# Patient Record
Sex: Female | Born: 1955 | Race: White | Hispanic: No | Marital: Married | Smoking: Never smoker
Health system: Southern US, Community
[De-identification: ages and names within clinical notes are randomized; demographics above are authoritative.]

## PROBLEM LIST (undated history)

## (undated) DIAGNOSIS — E785 Hyperlipidemia, unspecified: Secondary | ICD-10-CM

## (undated) DIAGNOSIS — K219 Gastro-esophageal reflux disease without esophagitis: Secondary | ICD-10-CM

## (undated) DIAGNOSIS — Z9889 Other specified postprocedural states: Secondary | ICD-10-CM

## (undated) DIAGNOSIS — F419 Anxiety disorder, unspecified: Secondary | ICD-10-CM

## (undated) DIAGNOSIS — R112 Nausea with vomiting, unspecified: Secondary | ICD-10-CM

## (undated) DIAGNOSIS — G4700 Insomnia, unspecified: Secondary | ICD-10-CM

## (undated) DIAGNOSIS — F32A Depression, unspecified: Secondary | ICD-10-CM

## (undated) DIAGNOSIS — G4730 Sleep apnea, unspecified: Secondary | ICD-10-CM

## (undated) DIAGNOSIS — I10 Essential (primary) hypertension: Secondary | ICD-10-CM

## (undated) DIAGNOSIS — J45909 Unspecified asthma, uncomplicated: Secondary | ICD-10-CM

## (undated) DIAGNOSIS — K2100 Gastro-esophageal reflux disease with esophagitis, without bleeding: Secondary | ICD-10-CM

## (undated) HISTORY — DX: Hyperlipidemia, unspecified: E78.5

## (undated) HISTORY — PX: OOPHORECTOMY: SHX86

## (undated) HISTORY — PX: ABDOMINAL HYSTERECTOMY: SHX81

## (undated) HISTORY — PX: TENNIS ELBOW RELEASE/NIRSCHEL PROCEDURE: SHX6651

## (undated) HISTORY — PX: FOOT SURGERY: SHX648

## (undated) HISTORY — PX: BREAST BIOPSY: SHX20

## (undated) HISTORY — DX: Essential (primary) hypertension: I10

## (undated) HISTORY — DX: Depression, unspecified: F32.A

## (undated) HISTORY — DX: Sleep apnea, unspecified: G47.30

## (undated) HISTORY — DX: Anxiety disorder, unspecified: F41.9

## (undated) HISTORY — DX: Gastro-esophageal reflux disease without esophagitis: K21.9

---

## 2004-05-06 ENCOUNTER — Ambulatory Visit: Payer: Self-pay | Admitting: Family Medicine

## 2004-05-14 ENCOUNTER — Ambulatory Visit: Payer: Self-pay | Admitting: Family Medicine

## 2005-05-13 ENCOUNTER — Ambulatory Visit: Payer: Self-pay | Admitting: Family Medicine

## 2006-05-16 ENCOUNTER — Ambulatory Visit: Payer: Self-pay | Admitting: Family Medicine

## 2007-05-23 ENCOUNTER — Ambulatory Visit: Payer: Self-pay | Admitting: Family Medicine

## 2008-05-29 ENCOUNTER — Ambulatory Visit: Payer: Self-pay | Admitting: Family Medicine

## 2009-06-01 ENCOUNTER — Ambulatory Visit: Payer: Self-pay | Admitting: Family Medicine

## 2009-12-25 ENCOUNTER — Ambulatory Visit: Payer: Self-pay | Admitting: Family Medicine

## 2010-06-21 ENCOUNTER — Ambulatory Visit: Payer: Self-pay | Admitting: Family Medicine

## 2010-07-06 ENCOUNTER — Ambulatory Visit: Payer: Self-pay | Admitting: Family Medicine

## 2010-07-20 ENCOUNTER — Ambulatory Visit: Payer: Self-pay | Admitting: Surgery

## 2010-09-01 ENCOUNTER — Ambulatory Visit: Payer: Self-pay | Admitting: Surgery

## 2010-12-08 ENCOUNTER — Ambulatory Visit: Payer: Self-pay | Admitting: Surgery

## 2010-12-31 ENCOUNTER — Ambulatory Visit: Payer: Self-pay | Admitting: Surgery

## 2011-06-27 ENCOUNTER — Ambulatory Visit: Payer: Self-pay | Admitting: Family Medicine

## 2011-10-04 ENCOUNTER — Ambulatory Visit: Payer: Self-pay | Admitting: Gastroenterology

## 2011-10-06 LAB — PATHOLOGY REPORT

## 2012-03-05 ENCOUNTER — Ambulatory Visit: Payer: Self-pay | Admitting: Internal Medicine

## 2012-03-12 ENCOUNTER — Ambulatory Visit: Payer: Self-pay | Admitting: Gastroenterology

## 2012-03-23 ENCOUNTER — Ambulatory Visit: Payer: Self-pay | Admitting: Gastroenterology

## 2012-03-30 ENCOUNTER — Ambulatory Visit: Payer: Self-pay | Admitting: Gastroenterology

## 2012-04-06 LAB — PATHOLOGY REPORT

## 2012-07-26 ENCOUNTER — Ambulatory Visit: Payer: Self-pay | Admitting: Family Medicine

## 2013-09-10 ENCOUNTER — Ambulatory Visit: Payer: Self-pay | Admitting: Family Medicine

## 2013-12-06 ENCOUNTER — Ambulatory Visit: Payer: Self-pay | Admitting: Physical Medicine and Rehabilitation

## 2014-01-13 DIAGNOSIS — M5416 Radiculopathy, lumbar region: Secondary | ICD-10-CM | POA: Insufficient documentation

## 2014-01-13 DIAGNOSIS — M47816 Spondylosis without myelopathy or radiculopathy, lumbar region: Secondary | ICD-10-CM | POA: Insufficient documentation

## 2014-01-13 DIAGNOSIS — G8929 Other chronic pain: Secondary | ICD-10-CM | POA: Insufficient documentation

## 2014-07-18 DIAGNOSIS — Z86010 Personal history of colonic polyps: Secondary | ICD-10-CM | POA: Insufficient documentation

## 2014-07-18 DIAGNOSIS — K2100 Gastro-esophageal reflux disease with esophagitis, without bleeding: Secondary | ICD-10-CM | POA: Insufficient documentation

## 2014-09-12 ENCOUNTER — Ambulatory Visit: Payer: Self-pay | Admitting: Family Medicine

## 2014-10-06 DIAGNOSIS — M5136 Other intervertebral disc degeneration, lumbar region: Secondary | ICD-10-CM | POA: Insufficient documentation

## 2014-12-26 DIAGNOSIS — M461 Sacroiliitis, not elsewhere classified: Secondary | ICD-10-CM | POA: Insufficient documentation

## 2015-06-30 ENCOUNTER — Other Ambulatory Visit: Payer: Self-pay | Admitting: Family Medicine

## 2015-09-08 ENCOUNTER — Other Ambulatory Visit: Payer: Self-pay | Admitting: Family Medicine

## 2015-09-08 DIAGNOSIS — Z1231 Encounter for screening mammogram for malignant neoplasm of breast: Secondary | ICD-10-CM

## 2015-09-23 ENCOUNTER — Ambulatory Visit: Payer: Self-pay | Attending: Family Medicine

## 2015-10-08 ENCOUNTER — Other Ambulatory Visit: Payer: Self-pay | Admitting: Physical Medicine and Rehabilitation

## 2015-10-08 DIAGNOSIS — M5417 Radiculopathy, lumbosacral region: Secondary | ICD-10-CM

## 2015-10-29 ENCOUNTER — Ambulatory Visit
Admission: RE | Admit: 2015-10-29 | Discharge: 2015-10-29 | Disposition: A | Discharge status: Home / Self Care | Payer: 59 | Source: Ambulatory Visit | Attending: Family Medicine | Admitting: Family Medicine

## 2015-10-29 ENCOUNTER — Ambulatory Visit
Admission: RE | Admit: 2015-10-29 | Discharge: 2015-10-29 | Disposition: A | Discharge status: Home / Self Care | Payer: 59 | Source: Ambulatory Visit | Attending: Physical Medicine and Rehabilitation | Admitting: Physical Medicine and Rehabilitation

## 2015-10-29 DIAGNOSIS — M47816 Spondylosis without myelopathy or radiculopathy, lumbar region: Secondary | ICD-10-CM | POA: Insufficient documentation

## 2015-10-29 DIAGNOSIS — Z1231 Encounter for screening mammogram for malignant neoplasm of breast: Secondary | ICD-10-CM

## 2015-10-29 DIAGNOSIS — M5416 Radiculopathy, lumbar region: Secondary | ICD-10-CM | POA: Insufficient documentation

## 2015-10-29 DIAGNOSIS — M5417 Radiculopathy, lumbosacral region: Secondary | ICD-10-CM

## 2016-01-15 ENCOUNTER — Other Ambulatory Visit: Payer: Self-pay | Admitting: Orthopedic Surgery

## 2016-01-15 DIAGNOSIS — M5442 Lumbago with sciatica, left side: Principal | ICD-10-CM

## 2016-01-15 DIAGNOSIS — G8929 Other chronic pain: Secondary | ICD-10-CM

## 2016-01-21 ENCOUNTER — Ambulatory Visit
Admission: RE | Admit: 2016-01-21 | Discharge: 2016-01-21 | Disposition: A | Discharge status: Home / Self Care | Payer: 59 | Source: Ambulatory Visit | Attending: Orthopedic Surgery | Admitting: Orthopedic Surgery

## 2016-01-21 ENCOUNTER — Encounter
Admission: RE | Admit: 2016-01-21 | Discharge: 2016-01-21 | Disposition: A | Discharge status: Home / Self Care | Payer: 59 | Source: Ambulatory Visit | Attending: Orthopedic Surgery | Admitting: Orthopedic Surgery

## 2016-01-21 DIAGNOSIS — M5442 Lumbago with sciatica, left side: Secondary | ICD-10-CM | POA: Insufficient documentation

## 2016-01-21 DIAGNOSIS — G8929 Other chronic pain: Secondary | ICD-10-CM | POA: Diagnosis not present

## 2016-01-21 MED ORDER — TECHNETIUM TC 99M MEDRONATE IV KIT
25.0000 | PACK | Freq: Once | INTRAVENOUS | Status: AC | PRN
  Administered 2016-01-21: 22.21 via INTRAVENOUS

## 2016-09-27 ENCOUNTER — Other Ambulatory Visit: Payer: Self-pay | Admitting: Family Medicine

## 2016-09-27 DIAGNOSIS — Z1231 Encounter for screening mammogram for malignant neoplasm of breast: Secondary | ICD-10-CM

## 2016-12-06 ENCOUNTER — Ambulatory Visit
Admission: RE | Admit: 2016-12-06 | Discharge: 2016-12-06 | Disposition: A | Discharge status: Home / Self Care | Payer: BLUE CROSS/BLUE SHIELD | Source: Ambulatory Visit | Attending: Family Medicine | Admitting: Family Medicine

## 2016-12-06 DIAGNOSIS — Z1231 Encounter for screening mammogram for malignant neoplasm of breast: Secondary | ICD-10-CM | POA: Insufficient documentation

## 2017-08-11 ENCOUNTER — Other Ambulatory Visit: Payer: Self-pay | Admitting: Family Medicine

## 2017-08-11 DIAGNOSIS — M5416 Radiculopathy, lumbar region: Secondary | ICD-10-CM

## 2017-08-18 ENCOUNTER — Ambulatory Visit
Admission: RE | Admit: 2017-08-18 | Discharge: 2017-08-18 | Disposition: A | Discharge status: Home / Self Care | Payer: BLUE CROSS/BLUE SHIELD | Source: Ambulatory Visit | Attending: Family Medicine | Admitting: Family Medicine

## 2017-08-18 DIAGNOSIS — M5416 Radiculopathy, lumbar region: Secondary | ICD-10-CM | POA: Diagnosis present

## 2017-09-04 ENCOUNTER — Encounter: Payer: Self-pay | Admitting: Podiatry

## 2017-09-04 ENCOUNTER — Ambulatory Visit: Payer: BLUE CROSS/BLUE SHIELD | Admitting: Podiatry

## 2017-09-04 VITALS — BP 117/87 | HR 65 | Resp 16

## 2017-09-04 DIAGNOSIS — R519 Headache, unspecified: Secondary | ICD-10-CM | POA: Insufficient documentation

## 2017-09-04 DIAGNOSIS — L600 Ingrowing nail: Secondary | ICD-10-CM

## 2017-09-04 DIAGNOSIS — R51 Headache: Secondary | ICD-10-CM

## 2017-09-04 DIAGNOSIS — M8580 Other specified disorders of bone density and structure, unspecified site: Secondary | ICD-10-CM | POA: Insufficient documentation

## 2017-09-04 DIAGNOSIS — B019 Varicella without complication: Secondary | ICD-10-CM | POA: Insufficient documentation

## 2017-09-04 NOTE — Progress Notes (Signed)
  Subjective:  Patient ID: Elizabeth Mann, female    DOB: 1955-12-04,  MRN: [id number] HPI Chief Complaint  Patient presents with  . Nail Problem    Hallux nail - thick, dark nail x years, tried "clear nail medicine" bought here before, helped some    62 y.o. female presents with the above complaint.     No past medical history on file. Past Surgical History:  Procedure Laterality Date  . BREAST BIOPSY Right    neg    Current Outpatient Medications:  .  estradiol (ESTRACE) 0.5 MG tablet, TAKE 1 TABLET BY MOUTH ONCE DAILY, Disp: , Rfl:  .  traZODone (DESYREL) 100 MG tablet, TAKE 1 AND 1/2 TABLETS BY MOUTH AT BEDTIME, MAY TAKE 2 TABLETS IF NEEDED, Disp: , Rfl:  .  aspirin EC 81 MG tablet, Take by mouth., Disp: , Rfl:  .  atorvastatin (LIPITOR) 10 MG tablet, Take 10 mg by mouth daily., Disp: , Rfl: 3 .  Calcium Carbonate-Vitamin D 600-400 MG-UNIT tablet, Take by mouth., Disp: , Rfl:  .  meloxicam (MOBIC) 7.5 MG tablet, TAKE 1 TABLET (7.5 MG TOTAL) BY MOUTH EVERY 12 (TWELVE) HOURS, Disp: , Rfl: 0 .  pantoprazole (PROTONIX) 40 MG tablet, Take 40 mg by mouth daily., Disp: , Rfl: 3 .  vitamin B-12 (CYANOCOBALAMIN) 1000 MCG tablet, Take by mouth., Disp: , Rfl:   Allergies  Allergen Reactions  . Contrast Media [Iodinated Diagnostic Agents]    Review of Systems  All other systems reviewed and are negative.  Objective:   Vitals:   09/04/17 1341  BP: 117/87  Pulse: 65  Resp: 16    General: Well developed, nourished, in no acute distress, alert and oriented x3   Dermatological: Skin is warm, dry and supple bilateral. Nails x 10 are well maintained; remaining integument appears unremarkable at this time. There are no open sores, no preulcerative lesions, no rash or signs of infection present.  Vascular: Dorsalis Pedis artery and Posterior Tibial artery pedal pulses are 2/4 bilateral with immedate capillary fill time. Pedal hair growth present. No varicosities and no lower  extremity edema present bilateral.   Neruologic: Grossly intact via light touch bilateral. Vibratory intact via tuning fork bilateral. Protective threshold with Semmes Wienstein monofilament intact to all pedal sites bilateral. Patellar and Achilles deep tendon reflexes 2+ bilateral. No Babinski or clonus noted bilateral.   Musculoskeletal: No gross boney pedal deformities bilateral. No pain, crepitus, or limitation noted with foot and ankle range of motion bilateral. Muscular strength 5/5 in all groups tested bilateral.  Gait: Unassisted, Nonantalgic.    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  Plan: After thorough discussion and verbal consent we remove the toenail plate today.  This was performed after local anesthesia was administered.  A total of 3 cc of a 50-50 mixture of Marcaine plain and lidocaine with epinephrine were injected around the hallux.  The nail and toe was prepped and draped as normal sterile fashion.  The nail plate was removed in toto.  She received both oral and written home-going instruction for the care and soaking of her toe as well as prescription for Cortisporin Otic to be applied.  I will follow-up with her in 1-2 weeks to make sure that this is healing well.     Shanika Levings T. Biscayne ParkHyatt, [place] Dakota[place]PM

## 2017-09-04 NOTE — Patient Instructions (Addendum)

## 2017-09-27 ENCOUNTER — Encounter: Payer: Self-pay | Admitting: Podiatry

## 2017-09-27 ENCOUNTER — Ambulatory Visit (INDEPENDENT_AMBULATORY_CARE_PROVIDER_SITE_OTHER): Payer: BLUE CROSS/BLUE SHIELD | Admitting: Podiatry

## 2017-09-27 ENCOUNTER — Encounter: Payer: BLUE CROSS/BLUE SHIELD | Admitting: Podiatry

## 2017-09-27 DIAGNOSIS — L600 Ingrowing nail: Secondary | ICD-10-CM

## 2017-09-27 NOTE — Progress Notes (Signed)
She presents today for follow-up of her nail avulsion hallux right.  She states it seems to be doing fine I have no problems with it.  Objective: Vital signs are stable alert and oriented x3 there is no erythema edema cellulitis drainage or odor the nail appears to be healing normally.  Assessment: Well-healing hallux nail right foot.  Plan: Follow-up with me as needed.

## 2017-10-20 ENCOUNTER — Other Ambulatory Visit: Payer: Self-pay | Admitting: Family Medicine

## 2017-10-20 DIAGNOSIS — Z1231 Encounter for screening mammogram for malignant neoplasm of breast: Secondary | ICD-10-CM

## 2017-12-08 ENCOUNTER — Ambulatory Visit
Admission: RE | Admit: 2017-12-08 | Discharge: 2017-12-08 | Disposition: A | Discharge status: Home / Self Care | Payer: BLUE CROSS/BLUE SHIELD | Source: Ambulatory Visit | Attending: Family Medicine | Admitting: Family Medicine

## 2017-12-08 DIAGNOSIS — Z1231 Encounter for screening mammogram for malignant neoplasm of breast: Secondary | ICD-10-CM | POA: Insufficient documentation

## 2018-08-16 ENCOUNTER — Other Ambulatory Visit: Payer: Self-pay | Admitting: Family Medicine

## 2018-08-16 DIAGNOSIS — Z1231 Encounter for screening mammogram for malignant neoplasm of breast: Secondary | ICD-10-CM

## 2019-01-31 ENCOUNTER — Other Ambulatory Visit: Payer: Self-pay

## 2019-01-31 ENCOUNTER — Ambulatory Visit
Admission: RE | Admit: 2019-01-31 | Discharge: 2019-01-31 | Disposition: A | Discharge status: Home / Self Care | Payer: BC Managed Care – PPO | Source: Ambulatory Visit | Attending: Family Medicine | Admitting: Family Medicine

## 2019-01-31 DIAGNOSIS — Z1231 Encounter for screening mammogram for malignant neoplasm of breast: Secondary | ICD-10-CM

## 2019-04-24 ENCOUNTER — Other Ambulatory Visit: Payer: Self-pay

## 2019-04-24 ENCOUNTER — Ambulatory Visit (INDEPENDENT_AMBULATORY_CARE_PROVIDER_SITE_OTHER): Payer: BLUE CROSS/BLUE SHIELD | Admitting: Podiatry

## 2019-04-24 ENCOUNTER — Encounter: Payer: Self-pay | Admitting: Podiatry

## 2019-04-24 DIAGNOSIS — L600 Ingrowing nail: Secondary | ICD-10-CM

## 2019-04-24 MED ORDER — NEOMYCIN-POLYMYXIN-HC 3.5-10000-1 OT SOLN: OTIC | 0 refills | Status: DC

## 2019-04-24 NOTE — Progress Notes (Signed)
She presents today chief complaint of a hallux nail right foot.  States that need to have this nail removed it grew back funny again not tired of looking at it like this.  It becomes painful.  Objective: I reviewed her past medical history medications and allergies.  Pulses are strongly palpable.  Neurologic sensorium is intact.  DP reflexes are intact.  Muscle strength is normal symmetrical.  Painful thick ingrown nail hallux right.  Assessment: Painful thick ingrown nail hallux right.  Plan: Discussed etiology pathology conservative versus surgical therapies at this point consented her for total nail matrixectomy.  We will perform that today after local anesthesia was administered she tolerated the procedure well without complications provide her with a prescription for Cortisporin Otic as well as instructions for soaking twice daily.  I will follow-up with her in 2 weeks.

## 2019-04-24 NOTE — Patient Instructions (Signed)
ANTIBACTERIAL SOAP INSTRUCTIONS  THE DAY AFTER PROCEDURE  Please follow the instructions your doctor has marked.   Shower as usual. Before getting out, place a drop of antibacterial liquid soap (Dial) on a wet, clean washcloth.  Gently wipe washcloth over affected area.  Afterward, rinse the area with warm water.  Blot the area dry with a soft cloth and cover with antibiotic ointment (neosporin, polysporin, bacitracin) and band aid or gauze and tape Place 3-4 drops of antibacterial liquid soap in a quart of warm tap water.  Submerge foot into water for 20 minutes.  If bandage was applied after your procedure, leave on to allow for easy lift off, then remove and continue with soak for the remaining time.  Next, blot area dry with a soft cloth and cover with a bandage.  Apply other medications as directed by your doctor, such as cortisporin otic solution (eardrops) or neosporin antibiotic ointmentBetadine Soak Instructions  Purchase an 8 oz. bottle of BETADINE solution (Povidone)  THE DAY AFTER THE PROCEDURE  Place 1 tablespoon of betadine solution in a quart of warm tap water.  Submerge your foot or feet with outer bandage intact for the initial soak; this will allow the bandage to become moist and wet for easy lift off.  Once you remove your bandage, continue to soak in the solution for 20 minutes.  This soak should be done twice a day.  Next, remove your foot or feet from solution, blot dry the affected area and cover.  You may use a band aid large enough to cover the area or use gauze and tape.  Apply other medications to the area as directed by the doctor such as cortisporin otic solution (ear drops) or neosporin.  IF YOUR SKIN BECOMES IRRITATED WHILE USING THESE INSTRUCTIONS, IT IS OKAY TO SWITCH TO EPSOM SALTS AND WATER OR WHITE VINEGAR AND WATER. 

## 2019-04-29 ENCOUNTER — Other Ambulatory Visit: Payer: Self-pay

## 2019-04-29 MED ORDER — DOXYCYCLINE HYCLATE 100 MG PO TABS: 100.0000 mg | ORAL_TABLET | Freq: Two times a day (BID) | ORAL | 0 refills | Status: DC

## 2019-04-29 NOTE — Progress Notes (Signed)
Patient called stated that her toe is really red, draining yellow, very painful to touch. I informed her that we will send in doxycycline per Dr. Stephenie Acres verbal instructions and to continue soaking once daily and using abt drops.  She verbalized instruction and will call back if symptoms get worse

## 2019-05-13 ENCOUNTER — Ambulatory Visit (INDEPENDENT_AMBULATORY_CARE_PROVIDER_SITE_OTHER): Payer: BC Managed Care – PPO | Admitting: Podiatry

## 2019-05-13 ENCOUNTER — Other Ambulatory Visit: Payer: Self-pay | Admitting: Podiatry

## 2019-05-13 ENCOUNTER — Encounter: Payer: Self-pay | Admitting: Podiatry

## 2019-05-13 ENCOUNTER — Other Ambulatory Visit: Payer: Self-pay

## 2019-05-13 DIAGNOSIS — L03031 Cellulitis of right toe: Secondary | ICD-10-CM

## 2019-05-13 MED ORDER — CADEXOMER IODINE 0.9 % EX GEL: 1.0000 "application " | Freq: Every day | CUTANEOUS | 1 refills | Status: DC | PRN

## 2019-05-13 MED ORDER — SANTYL 250 UNIT/GM EX OINT: 1.0000 "application " | TOPICAL_OINTMENT | Freq: Every day | CUTANEOUS | 0 refills | Status: DC

## 2019-05-13 NOTE — Progress Notes (Signed)
She presents today for follow-up of nail avulsion done on September 23.  She states that it looks better than it did but it burns.  Objective: Vital signs are stable she is alert and oriented x3.  Pulses are palpable.  There is mild erythema mild fibrin deposition 1 area of granulation tissue proximally.  There is no purulence no malodor.  Assessment: Well-healing matrixectomy hallux right.  Plan: At this point I am going to put some Santyl on the fibrin deposition to help get rid of that so that we can epithelialize a little faster.  She was put just a very small amount once daily and I will follow-up with her in a couple weeks.

## 2019-05-13 NOTE — Addendum Note (Signed)
Addended by: Graceann Congress D on: 05/13/2019 04:43 PM   Modules accepted: Orders

## 2020-03-06 ENCOUNTER — Other Ambulatory Visit: Payer: Self-pay | Admitting: Family Medicine

## 2020-03-06 DIAGNOSIS — Z1231 Encounter for screening mammogram for malignant neoplasm of breast: Secondary | ICD-10-CM

## 2020-03-24 DIAGNOSIS — M79676 Pain in unspecified toe(s): Secondary | ICD-10-CM

## 2020-03-30 ENCOUNTER — Other Ambulatory Visit: Payer: Self-pay

## 2020-03-30 ENCOUNTER — Ambulatory Visit
Admission: RE | Admit: 2020-03-30 | Discharge: 2020-03-30 | Disposition: A | Discharge status: Home / Self Care | Payer: BC Managed Care – PPO | Source: Ambulatory Visit | Attending: Family Medicine | Admitting: Family Medicine

## 2020-03-30 DIAGNOSIS — Z1231 Encounter for screening mammogram for malignant neoplasm of breast: Secondary | ICD-10-CM | POA: Diagnosis present

## 2021-03-25 ENCOUNTER — Other Ambulatory Visit: Payer: Self-pay | Admitting: Family Medicine

## 2021-03-25 DIAGNOSIS — Z1231 Encounter for screening mammogram for malignant neoplasm of breast: Secondary | ICD-10-CM

## 2021-04-02 ENCOUNTER — Other Ambulatory Visit: Payer: Self-pay

## 2021-04-02 ENCOUNTER — Ambulatory Visit
Admission: RE | Admit: 2021-04-02 | Discharge: 2021-04-02 | Disposition: A | Discharge status: Home / Self Care | Payer: BC Managed Care – PPO | Source: Ambulatory Visit | Attending: Family Medicine | Admitting: Family Medicine

## 2021-04-02 DIAGNOSIS — Z1231 Encounter for screening mammogram for malignant neoplasm of breast: Secondary | ICD-10-CM | POA: Diagnosis not present

## 2021-04-08 ENCOUNTER — Other Ambulatory Visit: Payer: Self-pay | Admitting: Family Medicine

## 2021-04-08 DIAGNOSIS — M5416 Radiculopathy, lumbar region: Secondary | ICD-10-CM

## 2021-04-09 ENCOUNTER — Other Ambulatory Visit: Payer: Self-pay

## 2021-04-09 ENCOUNTER — Ambulatory Visit
Admission: RE | Admit: 2021-04-09 | Discharge: 2021-04-09 | Disposition: A | Discharge status: Home / Self Care | Payer: BC Managed Care – PPO | Source: Ambulatory Visit | Attending: Family Medicine | Admitting: Family Medicine

## 2021-04-09 DIAGNOSIS — M5416 Radiculopathy, lumbar region: Secondary | ICD-10-CM

## 2021-07-05 ENCOUNTER — Ambulatory Visit (HOSPITAL_BASED_OUTPATIENT_CLINIC_OR_DEPARTMENT_OTHER): Payer: BC Managed Care – PPO | Admitting: Student in an Organized Health Care Education/Training Program

## 2021-07-05 ENCOUNTER — Ambulatory Visit
Admission: RE | Admit: 2021-07-05 | Discharge: 2021-07-05 | Disposition: A | Discharge status: Home / Self Care | Payer: BC Managed Care – PPO | Source: Ambulatory Visit | Attending: Student in an Organized Health Care Education/Training Program | Admitting: Student in an Organized Health Care Education/Training Program

## 2021-07-05 ENCOUNTER — Other Ambulatory Visit: Payer: Self-pay

## 2021-07-05 ENCOUNTER — Encounter: Payer: Self-pay | Admitting: Student in an Organized Health Care Education/Training Program

## 2021-07-05 VITALS — BP 143/78 | HR 58 | Temp 96.4°F | Resp 16 | Ht 62.0 in | Wt 182.9 lb

## 2021-07-05 DIAGNOSIS — G894 Chronic pain syndrome: Secondary | ICD-10-CM | POA: Insufficient documentation

## 2021-07-05 DIAGNOSIS — M5384 Other specified dorsopathies, thoracic region: Secondary | ICD-10-CM | POA: Insufficient documentation

## 2021-07-05 DIAGNOSIS — M5134 Other intervertebral disc degeneration, thoracic region: Secondary | ICD-10-CM | POA: Insufficient documentation

## 2021-07-05 DIAGNOSIS — M5416 Radiculopathy, lumbar region: Secondary | ICD-10-CM | POA: Insufficient documentation

## 2021-07-05 DIAGNOSIS — G8929 Other chronic pain: Secondary | ICD-10-CM | POA: Diagnosis present

## 2021-07-05 NOTE — Progress Notes (Signed)
Patient: Elizabeth Mann  Service Category: E/M  Provider: Gillis Santa, MD  DOB: 10-04-1955  DOS: 07/05/2021  Referring Provider: Harvest Dark, FNP  MRN: [id number]  Setting: Ambulatory outpatient  PCP: [place] Pink, MD  Type: New Patient  Specialty: Interventional Pain Management    Location: Office  Delivery: Face-to-face     Primary Reason(s) for Visit: Encounter for initial evaluation of one or more chronic problems (new to examiner) potentially causing chronic pain, and posing a threat to normal musculoskeletal function. (Level of risk: High) CC: Back Pain (low)  HPI  Elizabeth Mann is a 65 y.o. year old, female patient, who comes for the first time to our practice referred by Meeler, Sherren Kerns, FNP for our initial evaluation of her chronic pain. She has Chickenpox; DDD (degenerative disc disease), lumbar; Gastroesophageal reflux disease with esophagitis; HA (headache); Hx of adenomatous colonic polyps; Lumbar radiculopathy; Lumbar spondylosis; Osteopenia; Sacroiliitis ([place]); Thoracic spine dysfunction; and Chronic pain syndrome on their problem list. Today she comes in for evaluation of her Back Pain (low)  Pain Assessment: Location: Lower Back Radiating: down both legs to shins Onset: More than a month ago Duration: Chronic pain Quality: Cramping, Sharp Severity: 8 /10 (subjective, self-reported pain score)  Effect on ADL: has not been able to work since injury 2017; "hard to get in and out of car"; difficult to stay asleep Timing: Constant Modifying factors: heat, meds BP: (!) 143/78  HR: (!) 58  Onset and Duration: Present longer than 3 months Cause of pain: Unknown Severity: No change since onset, NAS-11 at its worse: 8/10, NAS-11 at its best: 7/10, NAS-11 now: 7/10, and NAS-11 on the average: 9/10 Timing: Morning, Afternoon, and Night Aggravating Factors:  denies Alleviating Factors:  denies Associated Problems: Night-time cramps, Depression, and Fatigue Quality of Pain:  Aching, Cramping, Sharp, and Tender Previous Examinations or Tests: Biopsy, Bone scan, CT scan, MRI scan, X-rays, and Nerve conduction test Previous Treatments: TENS  65 year old female who presents today for acute on chronic bilateral low back pain with radiating pain to the bilateral anterior lateral thighs going to the knee and anterior portion of the shins left greater than right which has been ongoing since May 2019.  Patient ambulates with a cane and utilizes a walker at home.  Medications have included tizanidine 4 milligrams at bedtime (daytime use is limited due to sedation), Ibuprofen (no relief), etodolac (gastritis), gabapentin 300 mg nightly (daytime use limited due to sedation), tramadol (no relief), Robaxin (moderate relief causes drowsiness), prednisone taper (little relief).  No benefit with previous L-ESI with worsening radicular pain. Patient is being referred by Dr Sharlet Salina with [facility] for SCS evaluation.  Nerve conduction velocity/EMG study done in 2017 with Dr. Manuella Ghazi unremarkable, negative for any sensory neuropathy. Normal study.   Of note, patient's injection history is below: Injections: 04/13/2021: Bilateral L5-S1 transforaminal ESI (no relief, no contrast) 02/03/2021: Bilateral L5 trigger point injections (mild benefit) 01/12/2021: Bilateral sacroiliac joint injection (no benefit, no contrast) 12/03/2020: Bilateral L4-5 transforaminal ESI (no contrast, complete relief of right lower extremity pain continued left lower extremity pain) 05 08/2017: Bilateral L4-5 transforaminal ESI (good relief x1 week)  10/04/2017: Left L4-5 transfemoral ESI (good relief times 3 weeks) 08/23/2017: Left L4-5 transfemoral ESI (good relief times 3 weeks) 10/08/15: Left L5-S1 transforaminal ESI (moderate relief for less than 1 week) 09/25/15: Left superior gluteal trigger point injection (no relief) 12/26/2014: Bilateral sacroiliac joint injection (good relief) 11/07/2014: Bilateral  L5-S1 transforaminal ESI without contrast (  mild to moderate relief) 10/17/2014: Right L5-S1 transforaminal ESI without contrast   Meds   Current Outpatient Medications:    aspirin EC 81 MG tablet, Take by mouth., Disp: , Rfl:    atorvastatin (LIPITOR) 10 MG tablet, Take 10 mg by mouth daily., Disp: , Rfl: 3   buPROPion ER (WELLBUTRIN SR) 100 MG 12 hr tablet, Take 100 mg by mouth 2 (two) times daily., Disp: , Rfl:    Calcium Carbonate-Vitamin D 600-400 MG-UNIT tablet, Take by mouth., Disp: , Rfl:    escitalopram (LEXAPRO) 10 MG tablet, Take 10 mg by mouth daily., Disp: , Rfl:    estradiol (ESTRACE) 0.5 MG tablet, TAKE 1 TABLET BY MOUTH ONCE DAILY, Disp: , Rfl:    gabapentin (NEURONTIN) 300 MG capsule, 2 (two) times daily., Disp: , Rfl:    meloxicam (MOBIC) 7.5 MG tablet, TAKE 1 TABLET (7.5 MG TOTAL) BY MOUTH EVERY 12 (TWELVE) HOURS, Disp: , Rfl: 0   methocarbamol (ROBAXIN) 750 MG tablet, Take 750 mg by mouth 4 (four) times daily., Disp: , Rfl:    pantoprazole (PROTONIX) 40 MG tablet, Take 40 mg by mouth daily., Disp: , Rfl: 3   traZODone (DESYREL) 100 MG tablet, TAKE 1 AND 1/2 TABLETS BY MOUTH AT BEDTIME, MAY TAKE 2 TABLETS IF NEEDED, Disp: , Rfl:    valACYclovir (VALTREX) 1000 MG tablet, Take 1,000 mg by mouth 2 (two) times daily., Disp: , Rfl:    vitamin B-12 (CYANOCOBALAMIN) 1000 MCG tablet, Take by mouth., Disp: , Rfl:    cadexomer iodine (IODOSORB) 0.9 % gel, Apply 1 application topically daily as needed for wound care., Disp: 40 g, Rfl: 1   collagenase (SANTYL) ointment, Apply 1 application topically daily., Disp: 15 g, Rfl: 0   neomycin-polymyxin-hydrocortisone (CORTISPORIN) OTIC solution, Apply one to two drops to toe after soaking twice daily., Disp: 10 mL, Rfl: 0  Imaging Review   Lumbosacral Imaging: Lumbar MR wo contrast: Results for orders placed during the hospital encounter of 04/09/21  MR LUMBAR SPINE WO CONTRAST  Narrative CLINICAL DATA:  Chronic low back pain, worse  over the last few weeks, left leg pain  EXAM: MRI LUMBAR SPINE WITHOUT CONTRAST  TECHNIQUE: Multiplanar, multisequence MR imaging of the lumbar spine was performed. No intravenous contrast was administered.  COMPARISON:  Lumbar spine MRI 08/18/2017  FINDINGS: Segmentation:  Standard.  Alignment:  Physiologic.  Vertebrae: Vertebral body heights are preserved. There is a small Schmorl's node indenting the superior L2 endplate. There is mild surrounding marrow edema. Marrow signal is otherwise normal.  Conus medullaris and cauda equina: Conus extends to the T12-L1 level. Conus and cauda equina appear normal.  Paraspinal and other soft tissues: The paraspinal soft tissues are unremarkable.  Disc levels:  There is mild disc desiccation without significant loss of height at L3-L4 and L4-L5, similar to the prior study. There is mild facet arthropathy at L4-L5 and L5-S1.  T12-L1: There is a small central disc protrusion without significant spinal canal or neural foraminal stenosis.  L1-L2: There is a minimal disc bulge without significant spinal canal or neural foraminal stenosis.  L2-L3: There is a minimal disc bulge and mild bilateral facet arthropathy resulting in mild left and no significant right neural foraminal stenosis without significant spinal canal stenosis.  L3-L4: There is a mild disc bulge and mild bilateral facet arthropathy without significant spinal canal or neural foraminal stenosis.  L4-L5: There is a minimal disc bulge and mild bilateral facet arthropathy resulting in mild bilateral neural foraminal  stenosis without significant spinal canal stenosis.  L5-S1: No significant spinal canal or neural foraminal stenosis.  The above findings are overall not significantly changed since 2019.  IMPRESSION: 1. Small Schmorl's node indenting the superior L2 endplate is new since 1610 and may be acute to subacute given marrow edema. This may be a source of  pain. 2. Otherwise, minimal degenerative changes in the lumbar spine as above without significant spinal canal or neural foraminal stenosis. No evidence of nerve root impingement.   Electronically Signed By: Valetta Mole M.D. On: 04/09/2021 12:17     Complexity Note: Imaging results reviewed. Results shared with Ms. Kasal, using Layman's terms.                         ROS  Cardiovascular: No reported cardiovascular signs or symptoms such as High blood pressure, coronary artery disease, abnormal heart rate or rhythm, heart attack, blood thinner therapy or heart weakness and/or failure Pulmonary or Respiratory: Snoring  Neurological: No reported neurological signs or symptoms such as seizures, abnormal skin sensations, urinary and/or fecal incontinence, being born with an abnormal open spine and/or a tethered spinal cord Psychological-Psychiatric: Anxiousness, Depressed, and Prone to panicking Gastrointestinal: Reflux or heatburn Genitourinary: No reported renal or genitourinary signs or symptoms such as difficulty voiding or producing urine, peeing blood, non-functioning kidney, kidney stones, difficulty emptying the bladder, difficulty controlling the flow of urine, or chronic kidney disease Hematological: No reported hematological signs or symptoms such as prolonged bleeding, low or poor functioning platelets, bruising or bleeding easily, hereditary bleeding problems, low energy levels due to low hemoglobin or being anemic Endocrine: No reported endocrine signs or symptoms such as high or low blood sugar, rapid heart rate due to high thyroid levels, obesity or weight gain due to slow thyroid or thyroid disease Rheumatologic: No reported rheumatological signs and symptoms such as fatigue, joint pain, tenderness, swelling, redness, heat, stiffness, decreased range of motion, with or without associated rash Musculoskeletal: Negative for myasthenia gravis, muscular dystrophy, multiple  sclerosis or malignant hyperthermia Work History: Out of work due to pain  Allergies  Ms. Laguna is allergic to contrast media [iodinated diagnostic agents].  Laboratory Chemistry Profile   Renal No results found for: BUN, CREATININE, LABCREA, BCR, GFR, GFRAA, GFRNONAA, SPECGRAV, PHUR, PROTEINUR   Electrolytes No results found for: NA, K, CL, CALCIUM, MG, PHOS   Hepatic No results found for: AST, ALT, ALBUMIN, ALKPHOS, AMYLASE, LIPASE, AMMONIA   ID No results found for: LYMEIGGIGMAB, HIV, SARSCOV2NAA, STAPHAUREUS, MRSAPCR, HCVAB, PREGTESTUR, RMSFIGG, QFVRPH1IGG, QFVRPH2IGG, LYMEIGGIGMAB   Bone No results found for: VD25OH, [id number], BJ4782NF6, OZ3086VH8, 25OHVITD1, 25OHVITD2, 25OHVITD3, TESTOFREE, TESTOSTERONE   Endocrine No results found for: GLUCOSE, GLUCOSEU, HGBA1C, TSH, FREET4, TESTOFREE, TESTOSTERONE, SHBG, ESTRADIOL, ESTRADIOLPCT, ESTRADIOLFRE, LABPREG, ACTH, CRTSLPL, UCORFRPERLTR, UCORFRPERDAY, CORTISOLBASE, LABPREG   Neuropathy No results found for: VITAMINB12, FOLATE, HGBA1C, HIV   CNS No results found for: COLORCSF, APPEARCSF, RBCCOUNTCSF, WBCCSF, POLYSCSF, LYMPHSCSF, EOSCSF, PROTEINCSF, GLUCCSF, JCVIRUS, CSFOLI, IGGCSF, LABACHR, ACETBL, LABACHR, ACETBL   Inflammation (CRP: Acute  ESR: Chronic) No results found for: CRP, ESRSEDRATE, LATICACIDVEN   Rheumatology No results found for: RF, ANA, LABURIC, URICUR, LYMEIGGIGMAB, LYMEABIGMQN, HLAB27   Coagulation No results found for: INR, LABPROT, APTT, PLT, DDIMER, LABHEMA, VITAMINK1, AT3   Cardiovascular No results found for: BNP, CKTOTAL, CKMB, TROPONINI, HGB, HCT, LABVMA, EPIRU, EPINEPH24HUR, NOREPRU, NOREPI24HUR, DOPARU, DOPAM24HRUR   Screening No results found for: SARSCOV2NAA, COVIDSOURCE, STAPHAUREUS, MRSAPCR, HCVAB, HIV, PREGTESTUR   Cancer No results  found for: CEA, CA125, LABCA2   Allergens No results found for: ALMOND, APPLE, ASPARAGUS, AVOCADO, BANANA, BARLEY, BASIL, BAYLEAF, GREENBEAN, LIMABEAN,  WHITEBEAN, BEEFIGE, REDBEET, BLUEBERRY, BROCCOLI, CABBAGE, MELON, CARROT, CASEIN, CASHEWNUT, CAULIFLOWER, CELERY     Note: Lab results reviewed.  PFSH  Drug: Ms. Glidden  reports no history of drug use. Alcohol:  reports no history of alcohol use. Tobacco:  reports that she has never smoked. She has never used smokeless tobacco. Medical:  has a past medical history of Anxiety, Depression, GERD (gastroesophageal reflux disease), Hyperlipidemia, Hypertension, and Sleep apnea. Family: family history is not on file.  Past Surgical History:  Procedure Laterality Date   ABDOMINAL HYSTERECTOMY     BREAST BIOPSY Right    neg   OOPHORECTOMY     Active Ambulatory Problems    Diagnosis Date Noted   Chickenpox 09/04/2017   DDD (degenerative disc disease), lumbar 10/06/2014   Gastroesophageal reflux disease with esophagitis 07/18/2014   HA (headache) 09/04/2017   Hx of adenomatous colonic polyps 07/18/2014   Lumbar radiculopathy 01/13/2014   Lumbar spondylosis 01/13/2014   Osteopenia 09/04/2017   Sacroiliitis ([place]) 12/26/2014   Thoracic spine dysfunction 07/05/2021   Chronic pain syndrome 07/05/2021   Resolved Ambulatory Problems    Diagnosis Date Noted   No Resolved Ambulatory Problems   Past Medical History:  Diagnosis Date   Anxiety    Depression    GERD (gastroesophageal reflux disease)    Hyperlipidemia    Hypertension    Sleep apnea    Constitutional Exam  General appearance: Well nourished, well developed, and well hydrated. In no apparent acute distress Vitals:   07/05/21 1345  BP: (!) 143/78  Pulse: (!) 58  Resp: 16  Temp: (!) 96.4 F (35.8 C)  TempSrc: Temporal  SpO2: 99%  Weight: 182 lb 14.4 oz (83 kg)  Height: 5' 2"  (1.575 m)   BMI Assessment: Estimated body mass index is 33.45 kg/m as calculated from the following:   Height as of this encounter: 5' 2"  (1.575 m).   Weight as of this encounter: 182 lb 14.4 oz (83 kg).  BMI interpretation table: BMI level  Category Range association with higher incidence of chronic pain  <18 kg/m2 Underweight   18.5-24.9 kg/m2 Ideal body weight   25-29.9 kg/m2 Overweight Increased incidence by 20%  30-34.9 kg/m2 Obese (Class I) Increased incidence by 68%  35-39.9 kg/m2 Severe obesity (Class II) Increased incidence by 136%  >40 kg/m2 Extreme obesity (Class III) Increased incidence by 254%   Patient's current BMI Ideal Body weight  Body mass index is 33.45 kg/m. Ideal body weight: 50.1 kg (110 lb 7.2 oz) Adjusted ideal body weight: 63.2 kg (139 lb 6.9 oz)   BMI Readings from Last 4 Encounters:  07/05/21 33.45 kg/m   Wt Readings from Last 4 Encounters:  07/05/21 182 lb 14.4 oz (83 kg)    Psych/Mental status: Alert, oriented x 3 (person, place, & time)       Eyes: PERLA Respiratory: No evidence of acute respiratory distress  Lumbar Spine Area Exam  Skin & Axial Inspection: No masses, redness, or swelling Alignment: Symmetrical Functional ROM: Pain restricted ROM       Stability: No instability detected Muscle Tone/Strength: Functionally intact. No obvious neuro-muscular anomalies detected. Sensory (Neurological): Dermatomal pain pattern  Gait & Posture Assessment  Ambulation: Patient ambulates using a cane Gait: Limited. Using assistive device to ambulate Posture: Difficulty standing up straight, due to pain  Lower Extremity Exam  Side: Right lower extremity  Side: Left lower extremity  Stability: No instability observed          Stability: No instability observed          Skin & Extremity Inspection: Skin color, temperature, and hair growth are WNL. No peripheral edema or cyanosis. No masses, redness, swelling, asymmetry, or associated skin lesions. No contractures.  Skin & Extremity Inspection: Skin color, temperature, and hair growth are WNL. No peripheral edema or cyanosis. No masses, redness, swelling, asymmetry, or associated skin lesions. No contractures.  Functional ROM: Pain restricted  ROM for hip and knee joints          Functional ROM: Pain restricted ROM for hip and knee joints          Muscle Tone/Strength: Functionally intact. No obvious neuro-muscular anomalies detected.  Muscle Tone/Strength: Functionally intact. No obvious neuro-muscular anomalies detected.  Sensory (Neurological): Dermatomal pain pattern        Sensory (Neurological): Dermatomal pain pattern        DTR: Patellar: deferred today Achilles: deferred today Plantar: deferred today  DTR: Patellar: deferred today Achilles: deferred today Plantar: deferred today  Palpation: No palpable anomalies  Palpation: No palpable anomalies    Assessment  Primary Diagnosis & Pertinent Problem List: The primary encounter diagnosis was Chronic radicular lumbar pain. Diagnoses of Lumbar radiculopathy, Thoracic spine dysfunction, Chronic pain syndrome, and Other intervertebral disc degeneration, thoracic region were also pertinent to this visit.  Visit Diagnosis (New problems to examiner): 1. Chronic radicular lumbar pain   2. Lumbar radiculopathy   3. Thoracic spine dysfunction   4. Chronic pain syndrome   5. Other intervertebral disc degeneration, thoracic region    Plan of Care (Initial workup plan)   I discussed  percutaneous spinal cord stimulator trial with the patient in detail.  I was able to evaluate the patient's interlaminar windows under live fluoroscopy and they appear patent for safe percutaneous access.   I explained to the patient that they will have an external power source and programmer which the patient will use for 7 days. There will be daily communication with the stimulator company and the patient. A possible need for a mid-trial clinic visit to give the patient the best chance of success.   Patient will need to have a thorough psychosocial behavioral evaluation. Our office will be happy to help facilitate this. Will place referral to Dr Lyman Speller.  Patient will also need a thoracic MRI to  rule out thoracic canal stenosis for spinal cord stimulator trial planning.  Some of patient's pain does seem to be mechanical in nature, with some component of neurogenic pain as well. We discussed the indications for spinal cord stimulation, specifically stating that it is typically better for neuropathic and appendicular pain, but that we have had some success in the treatment of low back and hip pain.   Patient is interested in proceeding with spinal cord stimulation trial.  I will start work-up with thoracic spine MRI as well as psychological clearance that is required by insurance.  After patient has completed both of these, she is instructed to contact our clinic for her second patient visit and to discuss any final questions that she may have about spinal cord stimulation.  I have provided her with resources for [employer].  Patient in agreement with plan.  All questions answered.   Imaging Orders         DG PAIN CLINIC C-ARM 1-60 MIN NO REPORT  MR THORACIC SPINE WO CONTRAST     Referral Orders         Ambulatory referral to Psychology     Plan for: [facility] Scientific spinal cord stimulator trial   Provider-requested follow-up: Return for patient will call to schedule 2nd visit after psych eval and T-MRI.  No future appointments.  Note by: Gillis Santa, MD Date: 07/05/2021; Time: 2:59 PM

## 2021-07-05 NOTE — Progress Notes (Signed)
Safety precautions to be maintained throughout the outpatient stay will include: orient to surroundings, keep bed in low position, maintain call bell within reach at all times, provide assistance with transfer out of bed and ambulation.  

## 2021-07-05 NOTE — Patient Instructions (Addendum)
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 With pain clinic. You will have MRI prior to next appt.

## 2021-07-27 ENCOUNTER — Ambulatory Visit
Admission: RE | Admit: 2021-07-27 | Discharge: 2021-07-27 | Disposition: A | Discharge status: Home / Self Care | Payer: BC Managed Care – PPO | Source: Ambulatory Visit | Attending: Student in an Organized Health Care Education/Training Program | Admitting: Student in an Organized Health Care Education/Training Program

## 2021-07-27 ENCOUNTER — Other Ambulatory Visit: Payer: Self-pay

## 2021-07-27 DIAGNOSIS — M5416 Radiculopathy, lumbar region: Secondary | ICD-10-CM

## 2021-07-27 DIAGNOSIS — G894 Chronic pain syndrome: Secondary | ICD-10-CM

## 2021-07-27 DIAGNOSIS — M5384 Other specified dorsopathies, thoracic region: Secondary | ICD-10-CM

## 2021-07-27 DIAGNOSIS — M5134 Other intervertebral disc degeneration, thoracic region: Secondary | ICD-10-CM

## 2021-08-17 ENCOUNTER — Ambulatory Visit (HOSPITAL_BASED_OUTPATIENT_CLINIC_OR_DEPARTMENT_OTHER): Payer: 59 | Admitting: Student in an Organized Health Care Education/Training Program

## 2021-08-17 ENCOUNTER — Other Ambulatory Visit: Payer: Self-pay | Admitting: Student in an Organized Health Care Education/Training Program

## 2021-08-17 ENCOUNTER — Ambulatory Visit
Admission: RE | Admit: 2021-08-17 | Discharge: 2021-08-17 | Disposition: A | Discharge status: Home / Self Care | Payer: 59 | Attending: Student in an Organized Health Care Education/Training Program | Admitting: Student in an Organized Health Care Education/Training Program

## 2021-08-17 ENCOUNTER — Ambulatory Visit
Admission: RE | Admit: 2021-08-17 | Discharge: 2021-08-17 | Disposition: A | Discharge status: Home / Self Care | Payer: 59 | Source: Ambulatory Visit | Attending: Student in an Organized Health Care Education/Training Program | Admitting: Student in an Organized Health Care Education/Training Program

## 2021-08-17 ENCOUNTER — Other Ambulatory Visit: Payer: Self-pay

## 2021-08-17 ENCOUNTER — Encounter: Payer: Self-pay | Admitting: Student in an Organized Health Care Education/Training Program

## 2021-08-17 VITALS — BP 152/87 | HR 51 | Temp 98.6°F | Resp 18 | Ht 62.0 in | Wt 180.0 lb

## 2021-08-17 DIAGNOSIS — G894 Chronic pain syndrome: Secondary | ICD-10-CM

## 2021-08-17 DIAGNOSIS — G8929 Other chronic pain: Secondary | ICD-10-CM

## 2021-08-17 DIAGNOSIS — M5416 Radiculopathy, lumbar region: Secondary | ICD-10-CM

## 2021-08-17 DIAGNOSIS — M5384 Other specified dorsopathies, thoracic region: Secondary | ICD-10-CM | POA: Diagnosis not present

## 2021-08-17 DIAGNOSIS — M47816 Spondylosis without myelopathy or radiculopathy, lumbar region: Secondary | ICD-10-CM | POA: Diagnosis not present

## 2021-08-17 MED ORDER — ORPHENADRINE CITRATE 30 MG/ML IJ SOLN
30.0000 mg | Freq: Once | INTRAMUSCULAR | Status: AC
  Administered 2021-08-17: 60 mg via INTRAMUSCULAR
  Filled 2021-08-17: qty 2

## 2021-08-17 MED ORDER — TIZANIDINE HCL 4 MG PO TABS: 4.0000 mg | ORAL_TABLET | Freq: Two times a day (BID) | ORAL | 0 refills | Status: DC | PRN

## 2021-08-17 MED ORDER — KETOROLAC TROMETHAMINE 30 MG/ML IJ SOLN
30.0000 mg | Freq: Once | INTRAMUSCULAR | Status: AC
Start: 2021-08-17 — End: 2021-08-17
  Administered 2021-08-17: 30 mg via INTRAMUSCULAR
  Filled 2021-08-17: qty 1

## 2021-08-17 NOTE — Patient Instructions (Addendum)
Please cleanse back day prior to procedure. ______________________________________________________________________  Preparing for your procedure (without sedation)  Procedure appointments are limited to planned procedures: No Prescription Refills. No disability issues will be discussed. No medication changes will be discussed.  Instructions: Oral Intake: Do not eat or drink anything for at least 6 hours prior to your procedure. (Exception: Blood Pressure Medication. See below.) Transportation: Unless otherwise stated by your physician, you may drive yourself after the procedure. Blood Pressure Medicine: Do not forget to take your blood pressure medicine with a sip of water the morning of the procedure. If your Diastolic (lower reading)is above 100 mmHg, elective cases will be cancelled/rescheduled. Blood thinners: These will need to be stopped for procedures. Notify our staff if you are taking any blood thinners. Depending on which one you take, there will be specific instructions on how and when to stop it. Diabetics on insulin: Notify the staff so that you can be scheduled 1st case in the morning. If your diabetes requires high dose insulin, take only  of your normal insulin dose the morning of the procedure and notify the staff that you have done so. Preventing infections: Shower with an antibacterial soap the morning of your procedure.  Build-up your immune system: Take 1000 mg of Vitamin C with every meal (3 times a day) the day prior to your procedure. Antibiotics: Inform the staff if you have a condition or reason that requires you to take antibiotics before dental procedures. Pregnancy: If you are pregnant, call and cancel the procedure. Sickness: If you have a cold, fever, or any active infections, call and cancel the procedure. Arrival: You must be in the facility at least 30 minutes prior to your scheduled procedure. Children: Do not bring any children with you. Dress  appropriately: Bring dark clothing that you would not mind if they get stained. Valuables: Do not bring any jewelry or valuables.  Reasons to call and reschedule or cancel your procedure: (Following these recommendations will minimize the risk of a serious complication.) Surgeries: Avoid having procedures within 2 weeks of any surgery. (Avoid for 2 weeks before or after any surgery). Flu Shots: Avoid having procedures within 2 weeks of a flu shots or . (Avoid for 2 weeks before or after immunizations). Barium: Avoid having a procedure within 7-10 days after having had a radiological study involving the use of radiological contrast. (Myelograms, Barium swallow or enema study). Heart attacks: Avoid any elective procedures or surgeries for the initial 6 months after a "Myocardial Infarction" (Heart Attack). Blood thinners: It is imperative that you stop these medications before procedures. Let us know if you if you take any blood thinner.  Infection: Avoid procedures during or within two weeks of an infection (including chest colds or gastrointestinal problems). Symptoms associated with infections include: Localized redness, fever, chills, night sweats or profuse sweating, burning sensation when voiding, cough, congestion, stuffiness, runny nose, sore throat, diarrhea, nausea, vomiting, cold or Flu symptoms, recent or current infections. It is specially important if the infection is over the area that we intend to treat. Heart and lung problems: Symptoms that may suggest an active cardiopulmonary problem include: cough, chest pain, breathing difficulties or shortness of breath, dizziness, ankle swelling, uncontrolled high or unusually low blood pressure, and/or palpitations. If you are experiencing any of these symptoms, cancel your procedure and contact your primary care physician for an evaluation.  Remember:  Regular Business hours are:  Monday to Thursday 8:00 AM to 4:00 PM  Provider's  Schedule: [place]  Laban Emperor, MD:  Procedure days: Tuesday and Thursday 7:30 AM to 4:00 PM  Edward Jolly, MD:  Procedure days: Monday and Wednesday 7:30 AM to 4:00 PM ______________________________________________________________________

## 2021-08-17 NOTE — Progress Notes (Signed)
Safety precautions to be maintained throughout the outpatient stay will include: orient to surroundings, keep bed in low position, maintain call bell within reach at all times, provide assistance with transfer out of bed and ambulation.  

## 2021-08-17 NOTE — Progress Notes (Signed)
PROVIDER NOTE: Information contained herein reflects review and annotations entered in association with encounter. Interpretation of such information and data should be left to medically-trained personnel. Information provided to patient can be located elsewhere in the medical record under "Patient Instructions". Document created using STT-dictation technology, any transcriptional errors that may result from process are unintentional.    Patient: Elizabeth Mann  Service Category: E/M  Provider: Gillis Santa, MD  DOB: [birth year]  DOS: 08/17/2021  Specialty: Interventional Pain Management  MRN: [id number]  Setting: Ambulatory outpatient  PCP: Maryland Pink, MD  Type: Established Patient    Referring Provider: Maryland Pink, MD  Location: Office  Delivery: Face-to-face     HPI  Elizabeth Mann, a 66 y.o. year old female, is here today because of her Chronic radicular lumbar pain [M54.16, G89.29]. Elizabeth Mann's primary complain today is Back Pain (lower) Last encounter: My last encounter with her was on 07/05/2021. Pertinent problems: Elizabeth Mann has Lumbar radiculopathy; Lumbar spondylosis; Thoracic spine dysfunction; and Chronic pain syndrome on their pertinent problem list. Pain Assessment: Severity of Chronic pain is reported as a 10-Worst pain ever/10. Location: Back Lower/around to groin bilat; down both legs to shins. Onset: More than a month ago. Quality: Sharp, Cramping, Tightness. Timing: Constant. Modifying factor(s):  Marland Kitchen Vitals:  height is _0  (1.575 m) and weight is 180 lb (81.6 kg). Her temporal temperature is 98.6 F (37 C). Her blood pressure is 152/87 (abnormal) and her pulse is 51 (abnormal). Her respiration is 18 and oxygen saturation is 99%.   Reason for encounter:   Increased thoracic and lumbar spine pain with radiation into left lateral leg and left groin in a dermatomal fashion.  Patient states that she bent over over the weekend and has noticed an increase in her radiating  pain which is very concerning for her.  No obvious fall but states that her pain noticeably worsened after she bent forward.  We will obtain x-rays of her lumbar and thoracic spine.  This is her second patient visit with me, she has completed her thoracic MRI and her psych eval for spinal cord stimulator trial work-up for chronic lumbar radicular pain in the context of failing conservative and interventional therapies.  Please see initial clinic note below.   Initial HPI 07/05/2021 66 year old female who presents today for acute on chronic bilateral low back pain with radiating pain to the bilateral anterior lateral thighs going to the knee and anterior portion of the shins left greater than right which has been ongoing since May 2019.  Patient ambulates with a cane and utilizes a walker at home.   Medications have included tizanidine 4 milligrams at bedtime (daytime use is limited due to sedation), Ibuprofen (no relief), etodolac (gastritis), gabapentin 300 mg nightly (daytime use limited due to sedation), tramadol (no relief), Robaxin (moderate relief causes drowsiness), prednisone taper (little relief).  No benefit with previous L-ESI with worsening radicular pain. Patient is being referred by Dr Sharlet Mann with [facility] for SCS evaluation.   Nerve conduction velocity/EMG study done in 2017 with Dr. Manuella Ghazi unremarkable, negative for any sensory neuropathy. Normal study.     Of note, patient's injection history is below: Injections: 04/13/2021: Bilateral L5-S1 transforaminal ESI (no relief, no contrast) 02/03/2021: Bilateral L5 trigger point injections (mild benefit) 01/12/2021: Bilateral sacroiliac joint injection (no benefit, no contrast) 12/03/2020: Bilateral L4-5 transforaminal ESI (no contrast, complete relief of right lower extremity pain continued left lower extremity pain) 05 08/2017: Bilateral L4-5 transforaminal  ESI (good relief x1 week)  10/04/2017: Left L4-5 transfemoral ESI (good  relief times 3 weeks) 08/23/2017: Left L4-5 transfemoral ESI (good relief times 3 weeks) 10/08/15: Left L5-S1 transforaminal ESI (moderate relief for less than 1 week) 09/25/15: Left superior gluteal trigger point injection (no relief) 12/26/2014: Bilateral sacroiliac joint injection (good relief) 11/07/2014: Bilateral L5-S1 transforaminal ESI without contrast (mild to moderate relief) 10/17/2014: Right L5-S1 transforaminal ESI without contrast    ROS  Constitutional: Denies any fever or chills Gastrointestinal: No reported hemesis, hematochezia, vomiting, or acute GI distress Musculoskeletal:  Low back pain, radiation into bilateral legs, left greater than right Neurological: No reported episodes of acute onset apraxia, aphasia, dysarthria, agnosia, amnesia, paralysis, loss of coordination, or loss of consciousness  Medication Review  Calcium Carbonate-Vitamin D, aspirin EC, atorvastatin, buPROPion ER, escitalopram, estradiol, gabapentin, pantoprazole, tiZANidine, traZODone, valACYclovir, and vitamin B-12  History Review  Allergy: Elizabeth Mann is allergic to contrast media [iodinated contrast media]. Drug: Elizabeth Mann  reports no history of drug use. Alcohol:  reports no history of alcohol use. Tobacco:  reports that she has never smoked. She has never used smokeless tobacco. Social: Elizabeth Mann  reports that she has never smoked. She has never used smokeless tobacco. She reports that she does not drink alcohol and does not use drugs. Medical:  has a past medical history of Anxiety, Depression, GERD (gastroesophageal reflux disease), Hyperlipidemia, Hypertension, and Sleep apnea. Surgical: Elizabeth Mann  has a past surgical history that includes Breast biopsy (Right); Abdominal hysterectomy; and Oophorectomy. Family: family history is not on file.  Laboratory Chemistry Profile   Renal No results found for: BUN, CREATININE, LABCREA, BCR, GFR, GFRAA, GFRNONAA, LABVMA, EPIRU, EPINEPH24HUR,  NOREPRU, NOREPI24HUR, DOPARU, [id number]  Hepatic No results found for: AST, ALT, ALBUMIN, ALKPHOS, HCVAB, AMYLASE, LIPASE, AMMONIA  Electrolytes No results found for: NA, K, CL, CALCIUM, MG, PHOS  Bone No results found for: VD25OH, [id number], QA4497NP0, YF1102TR1, 25OHVITD1, 25OHVITD2, 25OHVITD3, TESTOFREE, TESTOSTERONE  Inflammation (CRP: Acute Phase) (ESR: Chronic Phase) No results found for: CRP, ESRSEDRATE, LATICACIDVEN       Note: Above Lab results reviewed.  Recent Imaging Review  MR THORACIC SPINE WO CONTRAST CLINICAL DATA:  Mid back pain radiating down left leg  EXAM: MRI THORACIC SPINE WITHOUT CONTRAST  TECHNIQUE: Multiplanar, multisequence MR imaging of the thoracic spine was performed. No intravenous contrast was administered.  COMPARISON:  CT chest 07/06/2010  FINDINGS: Alignment:  Normal.  Vertebrae: There is a small Schmorl's node indenting the superior T3 endplate. Vertebral body heights are otherwise preserved. Marrow signal is normal.  Cord:  Normal in signal and morphology.  Paraspinal and other soft tissues: Unremarkable.  Disc levels:  The disc spaces are overall preserved. There is a small protrusion at T12-L1 without significant spinal canal or neural foraminal stenosis. There is no significant spinal canal or neural foraminal stenosis at the other levels.  IMPRESSION: Small Schmorl's node indenting the superior T3 endplate and small disc protrusion at T12-L1 without significant spinal canal or neural foraminal stenosis. Otherwise, unremarkable thoracic spine MRI.  Electronically Signed   By: Valetta Mole M.D.   On: 07/27/2021 09:12 Note: Reviewed               Physical Exam  General appearance: Well nourished, well developed, and well hydrated. In no apparent acute distress Mental status: Alert, oriented x 3 (person, place, & time)       Respiratory: No evidence of acute respiratory distress Eyes: PERLA Vitals: BP (!) 152/87  Pulse (!) 51    Temp 98.6 F (37 C) (Temporal)    Resp 18    Ht _0  (1.575 m)    Wt 180 lb (81.6 kg)    SpO2 99%    BMI 32.92 kg/m  BMI: Estimated body mass index is 32.92 kg/m as calculated from the following:   Height as of this encounter: _1  (1.575 m).   Weight as of this encounter: 180 lb (81.6 kg). Ideal: Ideal body weight: 50.1 kg (110 lb 7.2 oz) Adjusted ideal body weight: 62.7 kg (138 lb 4.3 oz)  Lumbar Spine Area Exam  Skin & Axial Inspection: No masses, redness, or swelling Alignment: Symmetrical Functional ROM: Pain restricted ROM       Stability: No instability detected Muscle Tone/Strength: Functionally intact. No obvious neuro-muscular anomalies detected. Sensory (Neurological): Dermatomal pain pattern   Gait & Posture Assessment  Ambulation: Patient ambulates using a cane Gait: Limited. Using assistive device to ambulate Posture: Difficulty standing up straight, due to pain  Lower Extremity Exam      Side: Right lower extremity   Side: Left lower extremity  Stability: No instability observed           Stability: No instability observed          Skin & Extremity Inspection: Skin color, temperature, and hair growth are WNL. No peripheral edema or cyanosis. No masses, redness, swelling, asymmetry, or associated skin lesions. No contractures.   Skin & Extremity Inspection: Skin color, temperature, and hair growth are WNL. No peripheral edema or cyanosis. No masses, redness, swelling, asymmetry, or associated skin lesions. No contractures.  Functional ROM: Pain restricted ROM for hip and knee joints           Functional ROM: Pain restricted ROM for hip and knee joints          Muscle Tone/Strength: Functionally intact. No obvious neuro-muscular anomalies detected.   Muscle Tone/Strength: Functionally intact. No obvious neuro-muscular anomalies detected.  Sensory (Neurological): Dermatomal pain pattern         Sensory (Neurological): Dermatomal pain pattern         DTR: Patellar: deferred today Achilles: deferred today Plantar: deferred today   DTR: Patellar: deferred today Achilles: deferred today Plantar: deferred today  Palpation: No palpable anomalies   Palpation: No palpable anomalies     Assessment   Status Diagnosis  Worsening Worsening Controlled 1. Chronic radicular lumbar pain   2. Lumbar radiculopathy   3. Thoracic spine dysfunction   4. Chronic pain syndrome   5. Lumbar spondylosis      Updated Problems: Problem  Thoracic Spine Dysfunction  Chronic Pain Syndrome  Lumbar Radiculopathy  Lumbar Spondylosis    Plan of Care    I discussed  percutaneous spinal cord stimulator trial with the patient in detail.  I was able to evaluate the patient's interlaminar windows under live fluoroscopy and they appear patent for safe percutaneous access.    I explained to the patient that they will have an external power source and programmer which the patient will use for 7 days. There will be daily communication with the stimulator company and the patient. A possible need for a mid-trial clinic visit to give the patient the best chance of success.   Patient has completed psychosocial behavioral evaluation.  It states "Ms. Gerken is predicted prognostic category for the SCS implant procedure is good if she remains compliant with his psychiatric care plan.  Her psychiatric status should not negatively impact  the SCS procedure.  Thus Ms. Jiron is cleared to go through the SCS procedure."   Status post thoracic MRI to rule out thoracic canal stenosis for spinal cord stimulator trial planning.  Some of patient's pain does seem to be mechanical in nature, with some component of neurogenic pain as well. We discussed the indications for spinal cord stimulation, specifically stating that it is typically better for neuropathic and appendicular pain, but that we have had some success in the treatment of low back and hip pain.   Patient is  interested in proceeding with spinal cord stimulation trial.  I have provided her with resources for [employer] and we will attempt to seek approval to proceed with [employer] SCS trial..  For her acutely increased low back and thoracic pain, will obtain thoracic and lumbar spine x-rays to rule out any acute fracture.  We will also administer intramuscular Norflex and Toradol today for increased low back pain with radiation into her left leg.  Pharmacotherapy (Medications Ordered): Meds ordered this encounter  Medications   tiZANidine (ZANAFLEX) 4 MG tablet    Sig: Take 1 tablet (4 mg total) by mouth every 12 (twelve) hours as needed for muscle spasms.    Dispense:  60 tablet    Refill:  0    Do not place this medication, or any other prescription from our practice, on "Automatic Refill". Patient may have prescription filled one day early if pharmacy is closed on scheduled refill date.   orphenadrine (NORFLEX) injection 30 mg   ketorolac (TORADOL) 30 MG/ML injection 30 mg   Orders:  Orders Placed This Encounter  Procedures   [employer] [facility] representative to notify them of the scheduled case and to make sure they will be available to provide required equipment.    Standing Status:   Future    Standing Expiration Date:   02/14/2022    Scheduling Instructions:     Side: Bilateral     Level: Lumbar     Device: [employer]     Sedation: With sedation     Timeframe: As soon as Emergency planning/management officer Question:   Where will this procedure be performed?    Answer:   [facility]   DG Lumbar Spine Complete W/Bend    Patient presents with axial pain with possible radicular component. Please assist [place] in identifying specific level(s) and laterality of any additional findings such as: 1. Facet (Zygapophyseal) joint DJD (Hypertrophy, space narrowing, subchondral sclerosis, and/or osteophyte formation) 2. DDD and/or IVDD (Loss of disc  height, desiccation, gas patterns, osteophytes, endplate sclerosis, or "Black disc disease") 3. Pars defects 4. Spondylolisthesis, spondylosis, and/or spondyloarthropathies (include Degree/Grade of displacement in mm) (stability) 5. Vertebral body Fractures (acute/chronic) (state percentage of collapse) 6. Demineralization (osteopenia/osteoporotic) 7. Bone pathology 8. Foraminal narrowing  9. Surgical changes    Standing Status:   Future    Standing Expiration Date:   09/17/2021    Scheduling Instructions:     Imaging must be done as soon as possible. Inform patient that order will expire within 30 days and I will not renew it.    Order Specific Question:   Reason for Exam (SYMPTOM  OR DIAGNOSIS REQUIRED)    Answer:   Low back pain    Order Specific Question:   Preferred imaging location?    Answer[facility]    [facility]    Order Specific Question:   Call Results- Best Contact  Number?    Answer:   [phone] ([facility])    Order Specific Question:   Radiology Contrast Protocol - do NOT remove file path    Answer:   [email or web address]    Order Specific Question:   Release to patient    Answer:   Immediate   DG Thoracic Spine 4V    Patient presents with axial pain with possible radicular component. Please assist [place] in identifying specific level(s) and laterality of any additional findings such as: 1. Facet (Zygapophyseal) joint DJD (Hypertrophy, space narrowing, subchondral sclerosis, and/or osteophyte formation) 2. DDD and/or IVDD (Loss of disc height, desiccation, gas patterns, osteophytes, endplate sclerosis, or "Black disc disease") 3. Pars defects 4. Spondylolisthesis, spondylosis, and/or spondyloarthropathies (include Degree/Grade of displacement in mm) (stability) 5. Vertebral body Fractures (acute/chronic) (state percentage of collapse) 6. Demineralization (osteopenia/osteoporotic) 7. Bone pathology 8. Foraminal narrowing  9.  Surgical changes    Standing Status:   Future    Standing Expiration Date:   11/15/2021    Order Specific Question:   Reason for Exam (SYMPTOM  OR DIAGNOSIS REQUIRED)    Answer:   Upper back pain and/or thoracic spine pain.    Order Specific Question:   Preferred imaging location?    Answer:   [facility]    Order Specific Question:   Call Results- Best Contact Number?    Answer:   [phone] [facility])   Follow-up plan:   Return in about 2 weeks (around 08/31/2021) for [employer] SCS trial.    Recent Visits Date Type Provider Dept  07/05/21 Office Visit Gillis Santa, MD [facility]  Showing recent visits within past 90 days and meeting all other requirements Today's Visits Date Type Provider Dept  08/17/21 Office Visit Gillis Santa, MD [facility]  Showing today's visits and meeting all other requirements Future Appointments No visits were found meeting these conditions. Showing future appointments within next 90 days and meeting all other requirements  I discussed the assessment and treatment plan with the patient. The patient was provided an opportunity to ask questions and all were answered. The patient agreed with the plan and demonstrated an understanding of the instructions.  Patient advised to call back or seek an in-person evaluation if the symptoms or condition worsens.  Duration of encounter: 30mnutes.  Note by: BGillis Santa MD Date: 08/17/2021; Time: 10:41 AM

## 2021-08-24 ENCOUNTER — Ambulatory Visit: Payer: BC Managed Care – PPO | Admitting: Student in an Organized Health Care Education/Training Program

## 2021-09-01 ENCOUNTER — Other Ambulatory Visit: Payer: Self-pay

## 2021-09-01 ENCOUNTER — Ambulatory Visit
Admission: RE | Admit: 2021-09-01 | Discharge: 2021-09-01 | Disposition: A | Discharge status: Home / Self Care | Payer: 59 | Source: Ambulatory Visit | Attending: Student in an Organized Health Care Education/Training Program | Admitting: Student in an Organized Health Care Education/Training Program

## 2021-09-01 ENCOUNTER — Encounter: Payer: Self-pay | Admitting: Student in an Organized Health Care Education/Training Program

## 2021-09-01 ENCOUNTER — Ambulatory Visit (HOSPITAL_BASED_OUTPATIENT_CLINIC_OR_DEPARTMENT_OTHER): Payer: 59 | Admitting: Student in an Organized Health Care Education/Training Program

## 2021-09-01 DIAGNOSIS — G8929 Other chronic pain: Secondary | ICD-10-CM | POA: Insufficient documentation

## 2021-09-01 DIAGNOSIS — M5416 Radiculopathy, lumbar region: Secondary | ICD-10-CM | POA: Insufficient documentation

## 2021-09-01 DIAGNOSIS — G894 Chronic pain syndrome: Secondary | ICD-10-CM | POA: Insufficient documentation

## 2021-09-01 MED ORDER — LACTATED RINGERS IV SOLN
1000.0000 mL | Freq: Once | INTRAVENOUS | Status: AC
  Administered 2021-09-01: 1000 mL via INTRAVENOUS

## 2021-09-01 MED ORDER — CEFAZOLIN SODIUM 1 G IJ SOLR
INTRAMUSCULAR | Status: AC
  Filled 2021-09-01: qty 10

## 2021-09-01 MED ORDER — CEPHALEXIN 500 MG PO CAPS: 500.0000 mg | ORAL_CAPSULE | Freq: Four times a day (QID) | ORAL | 0 refills | Status: AC

## 2021-09-01 MED ORDER — MIDAZOLAM HCL 5 MG/5ML IJ SOLN
0.5000 mg | Freq: Once | INTRAMUSCULAR | Status: AC
  Administered 2021-09-01: 1.5 mg via INTRAVENOUS

## 2021-09-01 MED ORDER — FENTANYL CITRATE (PF) 100 MCG/2ML IJ SOLN
INTRAMUSCULAR | Status: AC
  Filled 2021-09-01: qty 2

## 2021-09-01 MED ORDER — MIDAZOLAM HCL 5 MG/5ML IJ SOLN
INTRAMUSCULAR | Status: AC
  Filled 2021-09-01: qty 5

## 2021-09-01 MED ORDER — LIDOCAINE HCL 2 % IJ SOLN
INTRAMUSCULAR | Status: AC
  Filled 2021-09-01: qty 20

## 2021-09-01 MED ORDER — CEFAZOLIN SODIUM-DEXTROSE 2-4 GM/100ML-% IV SOLN
2.0000 g | Freq: Once | INTRAVENOUS | Status: AC
  Administered 2021-09-01: 2 g via INTRAVENOUS

## 2021-09-01 MED ORDER — ROPIVACAINE HCL 2 MG/ML IJ SOLN
INTRAMUSCULAR | Status: AC
  Filled 2021-09-01: qty 20

## 2021-09-01 MED ORDER — ROPIVACAINE HCL 2 MG/ML IJ SOLN
9.0000 mL | Freq: Once | INTRAMUSCULAR | Status: AC
  Administered 2021-09-01: 9 mL via PERINEURAL

## 2021-09-01 MED ORDER — FENTANYL CITRATE (PF) 100 MCG/2ML IJ SOLN
25.0000 ug | INTRAMUSCULAR | Status: AC | PRN
  Administered 2021-09-01 (×4): 25 ug via INTRAVENOUS

## 2021-09-01 MED ORDER — LIDOCAINE HCL 2 % IJ SOLN
20.0000 mL | Freq: Once | INTRAMUSCULAR | Status: AC
  Administered 2021-09-01: 400 mg

## 2021-09-01 NOTE — Progress Notes (Signed)
PROVIDER NOTE: Interpretation of information contained herein should be left to medically-trained personnel. Specific patient instructions are provided elsewhere under "Patient Instructions" section of medical record. This document was created in part using STT-dictation technology, any transcriptional errors that may result from this process are unintentional.  Patient: Elizabeth Mann Type: Established DOB: [birth year] MRN: [id number] PCP: Jerl MinaHedrick, James, MD  Service: Procedure DOS: 09/01/2021 Setting: Ambulatory Location: Ambulatory outpatient facility Delivery: Face-to-face Provider: Edward JollyBilal Cleophas Yoak, MD Specialty: Interventional Pain Management Specialty designation: 09 Location: Outpatient facility Ref. Prov.: Edward JollyLateef, Safia Panzer, MD    Primary Reason for Admission: Surgical management of chronic pain condition.  Procedure:  Anesthesia, Analgesia, Anxiolysis:  Type: Trial Spinal Cord Neurostimulator Implant (Percutaneous, interlaminar, posterior epidural placement) Purpose: To determine if a permanent implant may be effective in controlling some or all of Elizabeth Mann's chronic pain symptoms.  Region: Lumbar Level: T12-L1(Left), L1/L2 (right) Laterality: Bilateral Paramedial  Anesthesia: Local (1-2% Lidocaine)  Sedation: Moderate  Guidance: Fluoroscopy            1. Chronic radicular lumbar pain   2. Lumbar radiculopathy   3. Chronic pain syndrome    NAS-11 Pain score:   Pre-procedure: 10-Worst pain ever/10   Post-procedure: 0-No pain/10   Pre-op H&P Assessment:  Elizabeth Mann is a 66 y.o. (year old), female patient, seen today for interventional treatment. She  has a past surgical history that includes Breast biopsy (Right); Abdominal hysterectomy; and Oophorectomy.  Initial Vital Signs:  Pulse/EKG Rate: 68 ECG Heart Rate: (!) 58 Temp: (!) 97 F (36.1 C) Resp: 18 BP: (!) 151/90 SpO2: 100 %  BMI: Estimated body mass index is 32.01 kg/m as calculated from the following:   Height  as of this encounter: 5\' 2"  (1.575 m).   Weight as of this encounter: 175 lb (79.4 kg).  Risk Assessment: Allergies: Reviewed. She is allergic to contrast media [iodinated contrast media].  Allergy Precautions: None required Coagulopathies: Reviewed. None identified.  Blood-thinner therapy: None at this time Active Infection(s): Reviewed. None identified. Elizabeth Mann is afebrile  Site Confirmation: Elizabeth Mann was asked to confirm the procedure and laterality before marking the site, which she did. Procedure checklist: Completed Consent: Before the procedure and under the influence of no sedative(s), amnesic(s), or anxiolytics, the patient was informed of the treatment options, risks and possible complications. To fulfill our ethical and legal obligations, as recommended by the American Medical Association's Code of Ethics, I have informed the patient of my clinical impression; the nature and purpose of the treatment or procedure; the risks, benefits, and possible complications of the intervention; the alternatives, including doing nothing; the risk(s) and benefit(s) of the alternative treatment(s) or procedure(s); and the risk(s) and benefit(s) of doing nothing.  Elizabeth Mann was provided with information about the general risks and possible complications associated with most interventional procedures. These include, but are not limited to: failure to achieve desired goals, infection, bleeding, organ or nerve damage, allergic reactions, paralysis, and/or death.  In addition, she was informed of those risks and possible complications associated to this particular procedure, which include, but are not limited to: damage to the implant; failure to decrease pain; local, systemic, or serious CNS infections, intraspinal abscess with possible cord compression and paralysis, or life-threatening such as meningitis; intrathecal and/or epidural bleeding with formation of hematoma with possible spinal cord  compression and permanent paralysis; organ damage; nerve injury or damage with subsequent sensory, motor, and/or autonomic system dysfunction, resulting in transient or permanent pain, numbness, and/or weakness of  one or several areas of the body; allergic reactions, either minor or major life-threatening, such as anaphylactic or anaphylactoid reactions.  Furthermore, Elizabeth Mann was informed of those risks and complications associated with the medications. These include, but are not limited to: allergic reactions (i.e.: anaphylactic or anaphylactoid reactions); arrhythmia;  Hypotension/hypertension; cardiovascular collapse; respiratory depression and/or shortness of breath; swelling or edema; medication-induced neural toxicity; particulate matter embolism and blood vessel occlusion with resultant organ, and/or nervous system infarction and permanent paralysis.  Finally, she was informed that Medicine is not an exact science; therefore, there is also the possibility of unforeseen or unpredictable risks and/or possible complications that may result in a catastrophic outcome. The patient indicated having understood very clearly. We have given the patient no guarantees and we have made no promises. Enough time was given to the patient to ask questions, all of which were answered to the patient's satisfaction. Elizabeth Mann has indicated that she wanted to continue with the procedure. Attestation: I, the ordering provider, attest that I have discussed with the patient the benefits, risks, side-effects, alternatives, likelihood of achieving goals, and potential problems during recovery for the procedure that I have provided informed consent. Date   Time: 09/01/2021  7:48 AM  Pre-Procedure Preparation:  Monitoring: As per clinic protocol. Respiration, ETCO2, SpO2, BP, heart rate and rhythm monitor placed and checked for adequate function Safety Precautions: Patient was assessed for positional comfort and pressure  points before starting the procedure. Time-out: I initiated and conducted the "Time-out" before starting the procedure, as per protocol. The patient was asked to participate by confirming the accuracy of the "Time Out" information. Verification of the correct person, site, and procedure were performed and confirmed by me, the nursing staff, and the patient. "Time-out" conducted as per Joint Commission's Universal Protocol (UP.01.01.01). Time: 0838  Description of Procedure Process:   Position: Prone Target Area: Posterior epidural space Approach: Posterior percutaneous, paramedial, interlaminar approach Area Prepped: Bilateral thoraco-lumbar Region Prepping solution: ChloraPrep (2% chlorhexidine gluconate and 70% isopropyl alcohol) Safety Precautions: Safe injection practices and needle disposal techniques used. Medications properly checked for expiration dates. SDV (single dose vial) medications used. Aspiration looking for blood return and/or CSF was conducted prior to all injections. At no point did I inject any substances, as a needle was being advanced. No attempts were made at seeking any paresthesias.  Description of the Procedure: Availability of a responsible, adult driver, and NPO status confirmed. Informed consent was obtained after having discussed risks and possible complications. An IV was started. The patient was then taken to the fluoroscopy suite, where the patient was placed in position for the procedure, over the fluoroscopy table. The patient was then monitored in the usual manner. Fluoroscopy was manipulated to obtain the best possible view of the target. Parallex error was corrected before commencing the procedure. Once a clear view of the target had been obtained, the skin and deeper tissues over the procedure site were infiltrated using lidocaine, loaded in a 10 cc luer-loc syringe with a 0.5 inch, 25-G needle. The introducer needle(s) was/were then inserted through the skin and  deeper tissues. A paramidline approach was used to enter the posterior epidural space at a 30 angle, using Loss-of-resistance Technique with 3 ml of PF-NaCl (0.9% NSS). Correct needle placement was confirmed in the antero-posterior and lateral fluoroscopic views. The lead was gently introduced and manipulated under real-time fluoroscopy, constantly assessing for pain, discomfort, or paresthesias, until the tip rested at the desired level.  Electrode placement was  tested until appropriate coverage was attained. Once the patient confirmed that the stimulation was over the desired area, the lead(s) was/were secured in place and the introducer needles removed. This was done under real-time fluoroscopy while observing the electrode tip to avoid unintended migration. The area was covered with a non-occlusive dressing and the patient transported to recovery for further programming.  Vitals:   09/01/21 0940 09/01/21 0954 09/01/21 1000 09/01/21 1010  BP: (!) 145/81 (!) 145/71 139/66 (!) 153/74  Pulse:      Resp: 15 15 15 18   Temp:      SpO2: 100% 98% 98% 98%  Weight:      Height:       Start Time: 0838 hrs. End Time: 0950 hrs.  Neurostimulator Details:   Lead(s):  Brand: [employer]         Epidural Access Level:  T12-L1 L1-2  Lead implant:  Bilateral   No. of Electrodes/Lead:  16 16  Laterality:  Left Right  Top electrode location:  T7 T7  Bottom electrode location:  T9 T9  Model No.: [id number] Same  Length: 50cm Same  Lot No.:  MRI compatibility:  Yes Yes      Imaging Guidance (Spinal):          Type of Imaging Technique: Fluoroscopy Guidance (Spinal) Indication(s): Assistance in needle guidance and placement for procedures requiring needle placement in or near specific anatomical locations not easily accessible without such assistance. Exposure Time: Please see nurses notes. Contrast: None used. Fluoroscopic Guidance: I was personally present during the use  of fluoroscopy. "Tunnel Vision Technique" used to obtain the best possible view of the target area. Parallax error corrected before commencing the procedure. "Direction-depth-direction" technique used to introduce the needle under continuous pulsed fluoroscopy. Once target was reached, antero-posterior, oblique, and lateral fluoroscopic projection used confirm needle placement in all planes. Images permanently stored in EMR. Interpretation: No contrast injected. I personally interpreted the imaging intraoperatively. Adequate needle placement confirmed in multiple planes. Permanent images saved into the patient's record.  Antibiotic Prophylaxis:   Anti-infectives (From admission, onward)    Start     Dose/Rate Route Frequency Ordered Stop   09/01/21 0845  ceFAZolin (ANCEF) IVPB 2g/100 mL premix        2 g 200 mL/hr over 30 Minutes Intravenous  Once 09/01/21 0831 09/01/21 0904   09/01/21 0000  cephALEXin (KEFLEX) 500 MG capsule       Note to Pharmacy: Do not place medication on "Automatic Refill". Fill one day early if pharmacy is closed on scheduled refill date.   500 mg Oral 4 times daily 09/01/21 0820 09/08/21 2359      Indication(s): Procedural Prophylaxis.  Post-operative Assessment:  Post-procedure Vital Signs:  Pulse/HCG Rate: 68 62 Temp: (!) 97 F (36.1 C) Resp: 18 BP: (!) 153/74 SpO2: 98 %  Complications: No immediate post-treatment complications observed by team, or reported by patient.  Note: The patient tolerated the entire procedure well. A repeat set of vitals were taken after the procedure and the patient was kept under observation following institutional policy, for this type of procedure. Post-procedural neurological assessment was performed, showing return to baseline, prior to discharge. The patient was provided with post-procedure discharge instructions, including a section on how to identify potential problems. Should any problems arise concerning this procedure, the  patient was given instructions to immediately contact 11/06/21, at any time, without hesitation. In any case, we plan to contact the patient by telephone for a  follow-up status report regarding this interventional procedure.  Comments:  No additional relevant information.  Plan of Care  Orders:  Orders Placed This Encounter  Procedures   DG PAIN CLINIC C-ARM 1-60 MIN NO REPORT    Intraoperative interpretation by procedural physician at [facility].    Standing Status:   Standing    Number of Occurrences:   1    Order Specific Question:   Reason for exam:    Answer:   Assistance in needle guidance and placement for procedures requiring needle placement in or near specific anatomical locations not easily accessible without such assistance.   Requested Prescriptions   Signed Prescriptions Disp Refills   cephALEXin (KEFLEX) 500 MG capsule 28 capsule 0    Sig: Take 1 capsule (500 mg total) by mouth 4 (four) times daily for 7 days.   After visit summary with details regarding spinal cord stimulator and how to take care of it during the next 7 days.  Antibiotics have been sent to pharmacy.  Patient has contact information for Philip AspenKendra, [employer] spinal cord stim representative.  She will follow-up in 7 days for spinal cord stim percutaneous lead removal.  Medications administered: We administered lidocaine, lactated ringers, midazolam, fentaNYL, ceFAZolin, and ropivacaine (PF) 2 mg/mL (0.2%).  See the medical record for exact dosing, route, and time of administration.  Follow-up plan:   Return in about 1 week (around 09/08/2021) for SCS lead pull.     Recent Visits Date Type Provider Dept  08/17/21 Office Visit Edward JollyLateef, Trine Fread, MD [facility]  07/05/21 Office Visit Edward JollyLateef, Analyah Mcconnon, MD [facility]  Showing recent visits within past 90 days and meeting all other requirements Today's Visits Date Type Provider Dept  09/01/21 Procedure visit Edward JollyLateef, Jessejames Steelman, MD  [facility]  Showing today's visits and meeting all other requirements Future Appointments Date Type Provider Dept  09/08/21 Appointment Edward JollyLateef, Edelyn Heidel, MD [facility]  Showing future appointments within next 90 days and meeting all other requirements  Disposition: Discharge home  Discharge (Date   Time): 09/01/2021; 1021 hrs.   Primary Care Physician: Jerl MinaHedrick, James, MD Location: [facility] Note by: Edward JollyBilal Asees Manfredi, MD Date: 09/01/2021; Time: 11:17 AM

## 2021-09-01 NOTE — Patient Instructions (Addendum)
Today we did the following -We have done a Spinal Cord Stimulator Trial with [employer]  -As long as the leads are in place, do not bathe or shower. You may sponge bathe.  -While the lead is in place, please limit the bending, lifting, or twisting because the lead can move.  -The things we want to see is if your pain improves (and by what percentage), if you can do more activity (don't overdo it), and if you can use less of your "as needed" medicine. Do not stop long acting medicines like methadone, oxycontin, MS Contin, etc without checking with [place].  -It is VERY important that you pick up the antibiotics we prescribed, Keflex, on your way home from the trial and take them as prescribed(4 times a day), starting today, for as long as the lead is in place.  -The Spina Cord Stimulator Representative will be in contact with you while the lead is in place to make sure the trial goes as well as possible.  -Please contact us with any questions or concerns at any time during the trial.   -If you start running a fever over 100 degrees, have severe back pain, or new pain running down the legs, or drainage coming from the lead site, contact us immediately and/or go to the emergency room.  -Please do not restart any sort of medication that can thin your blood such as Aspirin, ibuprofen, motrin, aleve, plavix, coumadin, etc. If you aren't sure, call and ask.  -We will have you return in 7 days to have the lead removed. If this is successful, at that point we can go over the details about the permanent implant for which you will be referred to neurosurgery      __BLT'S 1.  No bending, 2.  No heavy lifting 3.  No twisting. 4.  No submerged baths or showers.  Keep the area as clean as possible. ________________________________________________________________________________________  Post-Procedure Discharge Instructions  Instructions: Apply ice:  Purpose: This will minimize any swelling and  discomfort after procedure.  When: Day of procedure, as soon as you get home. How: Fill a plastic sandwich bag with crushed ice. Cover it with a small towel and apply to injection site. How long: (15 min on, 15 min off) Apply for 15 minutes then remove x 15 minutes.  Repeat sequence on day of procedure, until you go to bed. Apply heat:  Purpose: To treat any soreness and discomfort from the procedure. When: Starting the next day after the procedure. How: Apply heat to procedure site starting the day following the procedure. How long: May continue to repeat daily, until discomfort goes away. Food intake: Start with clear liquids (like water) and advance to regular food, as tolerated.  Physical activities: Keep activities to a minimum for the first 8 hours after the procedure. After that, then as tolerated. Driving: If you have received any sedation, be responsible and do not drive. You are not allowed to drive for 24 hours after having sedation. Blood thinner: (Applies only to those taking blood thinners) You may restart your blood thinner 6 hours after your procedure. Insulin: (Applies only to Diabetic patients taking insulin) As soon as you can eat, you may resume your normal dosing schedule. Infection prevention: Keep procedure site clean and dry. Shower daily and clean area with soap and water. Post-procedure Pain Diary: Extremely important that this be done correctly and accurately. Recorded information will be used to determine the next step in treatment. For the purpose  of accuracy, follow these rules: Evaluate only the area treated. Do not report or include pain from an untreated area. For the purpose of this evaluation, ignore all other areas of pain, except for the treated area. After your procedure, avoid taking a long nap and attempting to complete the pain diary after you wake up. Instead, set your alarm clock to go off every hour, on the hour, for the initial 8 hours after the  procedure. Document the duration of the numbing medicine, and the relief you are getting from it. Do not go to sleep and attempt to complete it later. It will not be accurate. If you received sedation, it is likely that you were given a medication that may cause amnesia. Because of this, completing the diary at a later time may cause the information to be inaccurate. This information is needed to plan your care. Follow-up appointment: Keep your post-procedure follow-up evaluation appointment after the procedure (usually 2 weeks for most procedures, 6 weeks for radiofrequencies). DO NOT FORGET to bring you pain diary with you.   Expect: (What should I expect to see with my procedure?) From numbing medicine (AKA: Local Anesthetics): Numbness or decrease in pain. You may also experience some weakness, which if present, could last for the duration of the local anesthetic. Onset: Full effect within 15 minutes of injected. Duration: It will depend on the type of local anesthetic used. On the average, 1 to 8 hours.  From steroids (Applies only if steroids were used): Decrease in swelling or inflammation. Once inflammation is improved, relief of the pain will follow. Onset of benefits: Depends on the amount of swelling present. The more swelling, the longer it will take for the benefits to be seen. In some cases, up to 10 days. Duration: Steroids will stay in the system x 2 weeks. Duration of benefits will depend on multiple posibilities including persistent irritating factors. Side-effects: If present, they may typically last 2 weeks (the duration of the steroids). Frequent: Cramps (if they occur, drink Gatorade and take over-the-counter Magnesium 450-500 mg once to twice a day); water retention with temporary weight gain; increases in blood sugar; decreased immune system response; increased appetite. Occasional: Facial flushing (red, warm cheeks); mood swings; menstrual changes. Uncommon: Long-term decrease  or suppression of natural hormones; bone thinning. (These are more common with higher doses or more frequent use. This is why we prefer that our patients avoid having any injection therapies in other practices.)  Very Rare: Severe mood changes; psychosis; aseptic necrosis. From procedure: Some discomfort is to be expected once the numbing medicine wears off. This should be minimal if ice and heat are applied as instructed.  Call if: (When should I call?) You experience numbness and weakness that gets worse with time, as opposed to wearing off. New onset bowel or bladder incontinence. (Applies only to procedures done in the spine)  Emergency Numbers: Durning business hours (Monday - Thursday, 8:00 AM - 4:00 PM) (Friday, 9:00 AM - 12:00 Noon): [phone] After hours: [phone] NOTE: If you are having a problem and are unable connect with, or to talk to a provider, then go to your nearest urgent care or emergency department. If the problem is serious and urgent, please call 911. ____________________________________________________________________________________________

## 2021-09-01 NOTE — Progress Notes (Signed)
Safety precautions to be maintained throughout the outpatient stay will include: orient to surroundings, keep bed in low position, maintain call bell within reach at all times, provide assistance with transfer out of bed and ambulation.  

## 2021-09-02 ENCOUNTER — Telehealth: Payer: Self-pay | Admitting: *Deleted

## 2021-09-02 NOTE — Telephone Encounter (Signed)
No problems post procedure. 

## 2021-09-08 ENCOUNTER — Other Ambulatory Visit: Payer: Self-pay

## 2021-09-08 ENCOUNTER — Encounter: Payer: Self-pay | Admitting: Student in an Organized Health Care Education/Training Program

## 2021-09-08 ENCOUNTER — Other Ambulatory Visit: Payer: Self-pay | Admitting: Student in an Organized Health Care Education/Training Program

## 2021-09-08 ENCOUNTER — Ambulatory Visit (HOSPITAL_BASED_OUTPATIENT_CLINIC_OR_DEPARTMENT_OTHER): Payer: 59 | Admitting: Student in an Organized Health Care Education/Training Program

## 2021-09-08 ENCOUNTER — Ambulatory Visit
Admission: RE | Admit: 2021-09-08 | Discharge: 2021-09-08 | Disposition: A | Discharge status: Home / Self Care | Payer: 59 | Source: Ambulatory Visit | Attending: Student in an Organized Health Care Education/Training Program | Admitting: Student in an Organized Health Care Education/Training Program

## 2021-09-08 VITALS — BP 128/79 | HR 64 | Temp 97.0°F | Resp 18 | Ht 62.0 in | Wt 175.0 lb

## 2021-09-08 DIAGNOSIS — G8929 Other chronic pain: Secondary | ICD-10-CM

## 2021-09-08 DIAGNOSIS — G894 Chronic pain syndrome: Secondary | ICD-10-CM

## 2021-09-08 DIAGNOSIS — M5416 Radiculopathy, lumbar region: Secondary | ICD-10-CM

## 2021-09-08 NOTE — Progress Notes (Signed)
0927 SCS lead pull per Dr. Holley Raring. Bilateral leads pulled and I both intact. Both sites clear without S/S infection. Post op instructions given.

## 2021-09-08 NOTE — Progress Notes (Signed)
Safety precautions to be maintained throughout the outpatient stay will include: orient to surroundings, keep bed in low position, maintain call bell within reach at all times, provide assistance with transfer out of bed and ambulation.  

## 2021-09-08 NOTE — Patient Instructions (Signed)

## 2021-09-08 NOTE — Progress Notes (Signed)
PROVIDER NOTE: Information contained herein reflects review and annotations entered in association with encounter. Interpretation of such information and data should be left to medically-trained personnel. Information provided to patient can be located elsewhere in the medical record under "Patient Instructions". Document created using STT-dictation technology, any transcriptional errors that may result from process are unintentional.    Patient: Elizabeth Mann  Service Category: E/M  Provider: Edward Jolly, MD  DOB: 13-Oct-1955  DOS: 09/08/2021  Specialty: Interventional Pain Management  MRN: [id number]  Setting: Ambulatory outpatient  PCP: Jerl Mina, MD  Type: Established Patient    Referring Provider: Jerl Mina, MD  Location: Office  Delivery: Face-to-face     HPI  Ms. MADELEIN MAHADEO, a 66 y.o. year old female, is here today because of her Chronic radicular lumbar pain [M54.16, G89.29]. Ms. Mcdaid's primary complain today is Back Pain Last encounter: My last encounter with her was on 09/01/2021. Pertinent problems: Ms. Bai has Chronic radicular lumbar pain; Lumbar spondylosis; Thoracic spine dysfunction; and Chronic pain syndrome on their pertinent problem list. Pain Assessment: Severity of Chronic pain is reported as a 5 /10. Location: Back Lower, Right, Left/Radiates down left leg. Onset: More than a month ago. Quality: Contraction. Timing: Constant. Modifying factor(s): SCS is helping. Vitals:  height is 5\' 2"  (1.575 m) and weight is 175 lb (79.4 kg). Her temperature is 97 F (36.1 C) (abnormal). Her blood pressure is 128/79 and her pulse is 64. Her respiration is 18 and oxygen saturation is 100%.   Reason for encounter: post-procedure evaluation and assessment.    Patient presents today for [facility] spinal cord stimulator trial lead removal.  She noted benefits over this last week in regards to her pain and ability to complete chores and ADLs.  She rates her pain relief  over this last week at approximately 75% with improvement in functional status by being able to ambulate greater distance and less pain.  I will refer her to [facility]. [facility] with neurosurgery to discuss permanent paddle implant.  Spinal cord stimulator trial leads removed under live fluoroscopy.  Tips intact.  [employer] representative present.   ROS  Constitutional: Denies any fever or chills Gastrointestinal: No reported hemesis, hematochezia, vomiting, or acute GI distress Musculoskeletal:  Improvement in low back and left leg pain during trial duration Neurological: No reported episodes of acute onset apraxia, aphasia, dysarthria, agnosia, amnesia, paralysis, loss of coordination, or loss of consciousness  Medication Review  Calcium Carbonate-Vitamin D, aspirin EC, atorvastatin, buPROPion ER, cephALEXin, escitalopram, estradiol, gabapentin, pantoprazole, tiZANidine, traZODone, valACYclovir, and vitamin B-12  History Review  Allergy: Ms. Scharfenberg is allergic to contrast media [iodinated contrast media]. Drug: Ms. Devera  reports no history of drug use. Alcohol:  reports no history of alcohol use. Tobacco:  reports that she has never smoked. She has never used smokeless tobacco. Social: Ms. Curet  reports that she has never smoked. She has never used smokeless tobacco. She reports that she does not drink alcohol and does not use drugs. Medical:  has a past medical history of Anxiety, Depression, GERD (gastroesophageal reflux disease), Hyperlipidemia, Hypertension, and Sleep apnea. Surgical: Ms. Hollman  has a past surgical history that includes Breast biopsy (Right); Abdominal hysterectomy; and Oophorectomy. Family: family history is not on file.  Physical Exam  General appearance: Well nourished, well developed, and well hydrated. In no apparent acute distress Mental status: Alert, oriented x 3 (person, place, & time)       Respiratory: No evidence of  acute respiratory  distress Eyes: PERLA Vitals: BP 128/79    Pulse 64    Temp (!) 97 F (36.1 C)    Resp 18    Ht 5\' 2"  (1.575 m)    Wt 175 lb (79.4 kg)    SpO2 100%    BMI 32.01 kg/m  BMI: Estimated body mass index is 32.01 kg/m as calculated from the following:   Height as of this encounter: 5\' 2"  (1.575 m).   Weight as of this encounter: 175 lb (79.4 kg). Ideal: Ideal body weight: 50.1 kg (110 lb 7.2 oz) Adjusted ideal body weight: 61.8 kg (136 lb 4.3 oz)  Spinal cord stimulator trial insertion site: Clean, dry, not erythematous.  Trial leads removed under live fluoroscopy, tips intact.  Assessment   Status Diagnosis  Responding Responding Controlled 1. Chronic radicular lumbar pain   2. Lumbar radiculopathy   3. Chronic pain syndrome      Updated Problems: Problem  Chronic Radicular Lumbar Pain    Plan of Care    Status post successful [employer] spinal cord stimulator trial.  Referral to Dr. with neurosurgery for permanent paddle implant with .   Orders:  Orders Placed This Encounter  Procedures   DG PAIN CLINIC C-ARM 1-60 MIN NO REPORT    Intraoperative interpretation by procedural physician at [facility].    Standing Status:   Standing    Number of Occurrences:   1    Order Specific Question:   Reason for exam:    Answer:   Assistance in needle guidance and placement for procedures requiring needle placement in or near specific anatomical locations not easily accessible without such assistance.   Ambulatory referral to Neurosurgery    Referral Priority:   Routine    Referral Type:   Surgical    Referral Reason:   Specialty Services Required    Referred to Provider:   [employer], MD    Requested Specialty:   Neurosurgery    Number of Visits Requested:   1   Follow-up plan:   Return for NSG to discuss paddle implant.    Recent Visits Date Type Provider Dept  09/01/21 Procedure visit Venetia Night, MD [facility]  08/17/21 Office Visit Edward Jolly, MD [facility]  07/05/21 Office Visit Edward Jolly, MD [facility]  Showing recent visits within past 90 days and meeting all other requirements Today's Visits Date Type Provider Dept  09/08/21 Procedure visit Edward Jolly, MD [facility]  Showing today's visits and meeting all other requirements Future Appointments No visits were found meeting these conditions. Showing future appointments within next 90 days and meeting all other requirements  I discussed the assessment and treatment plan with the patient. The patient was provided an opportunity to ask questions and all were answered. The patient agreed with the plan and demonstrated an understanding of the instructions.  Patient advised to call back or seek an in-person evaluation if the symptoms or condition worsens.  Duration of encounter: 11/06/21.  Note by: Edward Jolly, MD Date: 09/08/2021; Time: 10:03 AM

## 2021-09-09 ENCOUNTER — Telehealth: Payer: Self-pay

## 2021-09-09 NOTE — Telephone Encounter (Signed)
Post procedure phone call. Patient states she is doing good.  

## 2021-09-13 ENCOUNTER — Telehealth: Payer: Self-pay

## 2021-09-13 ENCOUNTER — Other Ambulatory Visit: Payer: Self-pay | Admitting: Neurosurgery

## 2021-09-13 DIAGNOSIS — Z01818 Encounter for other preprocedural examination: Secondary | ICD-10-CM

## 2021-09-13 DIAGNOSIS — G894 Chronic pain syndrome: Secondary | ICD-10-CM

## 2021-09-13 DIAGNOSIS — G8929 Other chronic pain: Secondary | ICD-10-CM

## 2021-09-13 DIAGNOSIS — M5416 Radiculopathy, lumbar region: Secondary | ICD-10-CM

## 2021-09-13 NOTE — Telephone Encounter (Signed)
Patient needs a script for Tizanidine.  She had a procedure 09-08-2021 but medication was not reordered.

## 2021-09-14 MED ORDER — TIZANIDINE HCL 4 MG PO TABS: 4.0000 mg | ORAL_TABLET | Freq: Two times a day (BID) | ORAL | 2 refills | Status: AC | PRN

## 2021-10-01 ENCOUNTER — Other Ambulatory Visit: Payer: Self-pay

## 2021-10-01 ENCOUNTER — Encounter
Admission: RE | Admit: 2021-10-01 | Discharge: 2021-10-01 | Disposition: A | Discharge status: Home / Self Care | Payer: 59 | Source: Ambulatory Visit | Attending: Neurosurgery | Admitting: Neurosurgery

## 2021-10-01 VITALS — BP 143/73 | HR 61 | Temp 97.8°F

## 2021-10-01 DIAGNOSIS — Z01812 Encounter for preprocedural laboratory examination: Secondary | ICD-10-CM

## 2021-10-01 DIAGNOSIS — R82998 Other abnormal findings in urine: Secondary | ICD-10-CM | POA: Diagnosis not present

## 2021-10-01 DIAGNOSIS — Z01818 Encounter for other preprocedural examination: Secondary | ICD-10-CM | POA: Insufficient documentation

## 2021-10-01 DIAGNOSIS — R8290 Unspecified abnormal findings in urine: Secondary | ICD-10-CM | POA: Diagnosis not present

## 2021-10-01 HISTORY — DX: Insomnia, unspecified: G47.00

## 2021-10-01 LAB — TYPE AND SCREEN
ABO/RH(D): O POS
Antibody Screen: NEGATIVE

## 2021-10-01 LAB — BASIC METABOLIC PANEL
Anion gap: 7 (ref 5–15)
BUN: 14 mg/dL (ref 8–23)
CO2: 28 mmol/L (ref 22–32)
Calcium: 9.3 mg/dL (ref 8.9–10.3)
Chloride: 105 mmol/L (ref 98–111)
Creatinine, Ser: 0.92 mg/dL (ref 0.44–1.00)
GFR, Estimated: >60 mL/min (ref >60)
Glucose, Bld: 97 mg/dL (ref 70–99)
Potassium: 3.8 mmol/L (ref 3.5–5.1)
Sodium: 140 mmol/L (ref 135–145)

## 2021-10-01 LAB — CBC
HCT: 42.4 % (ref 36.0–46.0)
Hemoglobin: 13.3 g/dL (ref 12.0–15.0)
MCH: 28.7 pg (ref 26.0–34.0)
MCHC: 31.4 g/dL (ref 30.0–36.0)
MCV: 91.4 fL (ref 80.0–100.0)
Platelets: 321 10*3/uL (ref 150–400)
RBC: 4.64 MIL/uL (ref 3.87–5.11)
RDW: 14.8 % (ref 11.5–15.5)
WBC: 7 10*3/uL (ref 4.0–10.5)
nRBC: 0 % (ref 0.0–0.2)

## 2021-10-01 LAB — URINALYSIS, ROUTINE W REFLEX MICROSCOPIC
Bacteria, UA: NONE SEEN
Bilirubin Urine: NEGATIVE
Glucose, UA: NEGATIVE mg/dL
Hgb urine dipstick: NEGATIVE
Ketones, ur: NEGATIVE mg/dL
Nitrite: NEGATIVE
Protein, ur: NEGATIVE mg/dL
Specific Gravity, Urine: 1.004 — ABNORMAL LOW (ref 1.005–1.030)
pH: 6 (ref 5.0–8.0)

## 2021-10-01 LAB — SURGICAL PCR SCREEN
MRSA, PCR: NEGATIVE
Staphylococcus aureus: NEGATIVE

## 2021-10-01 LAB — PROTIME-INR
INR: 0.9 (ref 0.8–1.2)
Prothrombin Time: 12.5 seconds (ref 11.4–15.2)

## 2021-10-01 LAB — APTT: aPTT: 32 seconds (ref 24–36)

## 2021-10-01 NOTE — Pre-Procedure Instructions (Signed)
Patient was instructed by surgeon's office regarding stopping aspirin . ?

## 2021-10-01 NOTE — Patient Instructions (Addendum)
Your procedure is scheduled on:10/11/2021  ?Report to the Registration Desk on the 1st floor of the [facility]. ?To find out your arrival time, please call Same Day Surgery floor [phone] between 1PM - 3PM on:  10/08/2021 ? ?REMEMBER: ?Instructions that are not followed completely may result in serious medical risk, up to and including death; or upon the discretion of your surgeon and anesthesiologist your surgery may need to be rescheduled. ? ?Do not eat food after midnight the night before surgery.  ?No gum chewing, lozengers or hard candies. ? ?You may however, drink CLEAR liquids up to 2 hours before you are scheduled to arrive for your surgery. Do not drink anything within 2 hours of your scheduled arrival time. ? ?Clear liquids include: ?- water  ?- apple juice without pulp ?- gatorade (not RED colors) ?- black coffee or tea (Do NOT add milk or creamers to the coffee or tea) ?Do NOT drink anything that is not on this list. ? ? ? ?TAKE THESE MEDICATIONS THE MORNING OF SURGERY WITH A SIP OF WATER: ? ? pantoprazole (PROTONIX) (take one the night before and one on the morning of surgery - helps to prevent nausea after surgery.) ? ? ?Follow recommendations from Cardiologist, Pulmonologist or PCP or surgeon regarding stopping Aspirin.  ?  You were being instructed  by the surgeon's office about your aspirin.  ? ?One week prior to surgery: ?Stop Anti-inflammatories (NSAIDS) such as Advil, Aleve, Ibuprofen, Motrin, Naproxen, Naprosyn and Aspirin based products such as Excedrin, Goodys Powder, BC Powder. ?Stop ANY OVER THE COUNTER supplements until after surgery like ;  vitamin B-12, Cholecalciferol (VITAMIN D3), ?     Calcium Carbonate-Vitamin,  ? ?You may however, continue to take Tylenol if needed for pain up until the day of surgery. ? ?No Alcohol for 24 hours before or after surgery. ? ?No Smoking including e-cigarettes for 24 hours prior to surgery.  ?No chewable tobacco products for at least 6 hours prior  to surgery.  ?No nicotine patches on the day of surgery. ? ?Do not use any "recreational" drugs for at least a week prior to your surgery.  ?Please be advised that the combination of cocaine and anesthesia may have negative outcomes, up to and including death. ?If you test positive for cocaine, your surgery will be cancelled. ? ?On the morning of surgery brush your teeth with toothpaste and water, you may rinse your mouth with mouthwash if you wish. ?Do not swallow any toothpaste or mouthwash. ? ?Use CHG Soap or wipes as directed on instruction sheet- provided for you ? ?Do not wear jewelry, make-up, hairpins, clips or nail polish. ? ?Do not wear lotions, powders, or perfumes.  ? ?Do not shave body from the neck down 48 hours prior to surgery just in case you cut yourself which could leave a site for infection.  ?Also, freshly shaved skin may become irritated if using the CHG soap. ? ?Contact lenses, hearing aids and dentures may not be worn into surgery. ? ?Do not bring valuables to the hospital. [facility] is not responsible for any missing/lost belongings or valuables.  ? ? ? ?Notify your doctor if there is any change in your medical condition (cold, fever, infection). ? ?Wear comfortable clothing (specific to your surgery type) to the hospital. ? ?After surgery, you can help prevent lung complications by doing breathing exercises.  ?Take deep breaths and cough every 1-2 hours. Your doctor may order a device called an Incentive Spirometer to help  you take deep breaths. ? ?If you are being admitted to the hospital overnight, leave your suitcase in the car. ?After surgery it may be brought to your room. ? ?If you are being discharged the day of surgery, you will not be allowed to drive home. ?You will need a responsible adult (18 years or older) to drive you home and stay with you that night.  ? ?If you are taking public transportation, you will need to have a responsible adult (18 years or older) with  you. ?Please confirm with your physician that it is acceptable to use public transportation.  ? ?Please call the [facility] Dept. at [phone] if you have any questions about these instructions. ? ?Surgery Visitation Policy: ? ?Patients undergoing a surgery or procedure may have one family member or support person with them as long as that person is not COVID-19 positive or experiencing its symptoms.  ?That person may remain in the waiting area during the procedure and may rotate out with other people. ? ?Inpatient Visitation:   ? ?Visiting hours are 7 a.m. to 8 p.m. ?Up to two visitors ages 16+ are allowed at one time in a patient room. The visitors may rotate out with other people during the day. Visitors must check out when they leave, or other visitors will not be allowed. One designated support person may remain overnight. ?The visitor must pass COVID-19 screenings, use hand sanitizer when entering and exiting the patient?s room and wear a mask at all times, including in the patient?s room. ?Patients must also wear a mask when staff or their visitor are in the room. ?Masking is required regardless of vaccination status.  ?

## 2021-10-03 DIAGNOSIS — Z01812 Encounter for preprocedural laboratory examination: Secondary | ICD-10-CM

## 2021-10-03 DIAGNOSIS — B951 Streptococcus, group B, as the cause of diseases classified elsewhere: Secondary | ICD-10-CM

## 2021-10-03 DIAGNOSIS — N390 Urinary tract infection, site not specified: Secondary | ICD-10-CM

## 2021-10-03 LAB — URINE CULTURE: Culture: >=100000 — AB

## 2021-10-03 MED ORDER — CEPHALEXIN 500 MG PO CAPS: 500.0000 mg | ORAL_CAPSULE | Freq: Two times a day (BID) | ORAL | 0 refills | Status: AC

## 2021-10-03 NOTE — Progress Notes (Signed)
?[facility] ?Perioperative Services: Pre-Admission/Anesthesia Testing ? ?Abnormal Lab Notification and Treatment Plan of Care ?  ?Date: 10/03/21 ? ?Name: Elizabeth Mann ?MRN:   [id number] ? ?Re: Abnormal labs noted during PAT appointment  ? ?Notified:  ?Provider Name Provider Role Notification Mode  ?Venetia Night, MD Neurosurgery Routed and/or faxed via [facility]  ? ?Abnormal Lab Value(s):  ? ?Lab Results  ?Component Value Date  ? COLORURINE STRAW (A) 10/01/2021  ? APPEARANCEUR CLEAR (A) 10/01/2021  ? LABSPEC 1.004 (L) 10/01/2021  ? PHURINE 6.0 10/01/2021  ? GLUCOSEU NEGATIVE 10/01/2021  ? HGBUR NEGATIVE 10/01/2021  ? BILIRUBINUR NEGATIVE 10/01/2021  ? KETONESUR NEGATIVE 10/01/2021  ? PROTEINUR NEGATIVE 10/01/2021  ? NITRITE NEGATIVE 10/01/2021  ? LEUKOCYTESUR MODERATE (A) 10/01/2021  ? EPIU 0-5 10/01/2021  ? WBCU 0-5 10/01/2021  ? RBCU 0-5 10/01/2021  ? BACTERIA NONE SEEN 10/01/2021  ? CULT (A) 10/01/2021  ?  >=100,000 COLONIES/mL STREPTOCOCCUS AGALACTIAE ?TESTING AGAINST S. AGALACTIAE NOT ROUTINELY PERFORMED DUE TO PREDICTABILITY OF AMP/PEN/VAN SUSCEPTIBILITY. ?Performed at [facility], [address], [place], [place] [address] ?  ? ? ?Clinical Information and Notes:  ?Patient is scheduled for THORACIC LAMINECTOMY FOR SPINAL CORD STIMULATOR ([employer]) on 10/11/2021.  ? ?UA performed in PAT consistent with/concerning for infection.  ?No leukocytosis noted on CBC; WBC 7000 K/uL ?Renal function: Estimated Creatinine Clearance: 61.1 mL/min (by C-G formula based on SCr of 0.92 mg/dL). ?Urine C&S added to assess for pathogenically significant growth. ? ?Impression and Plan:  ?ALIANYS CHACKO with a UA that was (+) for infection; reflex culture sent. Culture with (+) significant GBS isolate. This could represent colonization with common vaginal bacteria versus true infection. Contacted patient to discuss. Patient reporting that she is experiencing back pain, lower abdominal pain,  and malodorous urine. She denied fevers, nausea, and vomiting. Patient with surgery scheduled soon (10/11/2021). In efforts to avoid delaying patient's procedure, or have her experience any potentially significant perioperative complications related to the aforementioned, I would like to proceed with empiric treatment for urinary tract infection. ? ?Allergies reviewed. Culture report also reviewed to ensure culture appropriate coverage is being provided. Will treat with a 7 day course of CEPHALEXIN. Patient encouraged to complete the entire course of antibiotics even if she begins to feel better.  ? ?Meds ordered this encounter  ?Medications  ? cephALEXin (KEFLEX) 500 MG capsule  ?  Sig: Take 1 capsule (500 mg total) by mouth 2 (two) times daily for 7 days. Increase WATER intake while taking this medication.  ?  Dispense:  14 capsule  ?  Refill:  0  ? ?Patient encouraged to increase her fluid intake as much as possible. Discussed that water is always best to flush the urinary tract. She was advised to avoid caffeine containing fluids until her infections clears, as caffeine can cause her to experience painful bladder spasms.  ? ?May use Tylenol as needed for pain/fever should she experience these symptoms. ? ?Patient instructed to call surgeon's office or PAT with any questions or concerns related to the above outlined course of treatment. Additionally, she was instructed to call if she feels like she is getting worse overall while on treatment. Results and treatment plan of care forwarded to primary attending surgeon to make them aware.  ? ?Encounter Diagnoses  ?Name Primary?  ? Pre-operative laboratory examination Yes  ? Group B streptococcal UTI   ? ?Quentin Mulling, MSN, APRN, FNP-C, CEN ?[facility]  ?Peri-operative Services Nurse  Practitioner ?Phone: [phone] ?Fax: [phone] ?10/03/21 6:39 PM ? ?NOTE: This note has been prepared using Scientist, clinical (histocompatibility and immunogenetics). Despite my best ability  to proofread, there is always the potential that unintentional transcriptional errors may still occur from this process. ?

## 2021-10-04 ENCOUNTER — Other Ambulatory Visit: Payer: Self-pay | Admitting: Student in an Organized Health Care Education/Training Program

## 2021-10-04 ENCOUNTER — Other Ambulatory Visit
Admission: RE | Admit: 2021-10-04 | Discharge: 2021-10-04 | Disposition: A | Discharge status: Home / Self Care | Payer: 59 | Source: Ambulatory Visit | Attending: Family Medicine | Admitting: Family Medicine

## 2021-10-04 DIAGNOSIS — M5416 Radiculopathy, lumbar region: Secondary | ICD-10-CM

## 2021-10-04 DIAGNOSIS — R2243 Localized swelling, mass and lump, lower limb, bilateral: Secondary | ICD-10-CM | POA: Insufficient documentation

## 2021-10-04 DIAGNOSIS — G8929 Other chronic pain: Secondary | ICD-10-CM

## 2021-10-04 DIAGNOSIS — G894 Chronic pain syndrome: Secondary | ICD-10-CM

## 2021-10-04 LAB — BRAIN NATRIURETIC PEPTIDE: B Natriuretic Peptide: 138.9 pg/mL — ABNORMAL HIGH (ref 0.0–100.0)

## 2021-10-11 ENCOUNTER — Ambulatory Visit
Admission: RE | Admit: 2021-10-11 | Discharge: 2021-10-11 | Disposition: A | Discharge status: Home / Self Care | Payer: 59 | Source: Ambulatory Visit | Attending: Neurosurgery | Admitting: Neurosurgery

## 2021-10-11 ENCOUNTER — Other Ambulatory Visit: Payer: Self-pay

## 2021-10-11 ENCOUNTER — Ambulatory Visit: Payer: 59 | Admitting: Urgent Care

## 2021-10-11 ENCOUNTER — Other Ambulatory Visit: Payer: Self-pay | Admitting: Family Medicine

## 2021-10-11 ENCOUNTER — Ambulatory Visit: Payer: 59

## 2021-10-11 ENCOUNTER — Encounter
Admission: RE | Disposition: A | Discharge status: Home / Self Care | Payer: Self-pay | Source: Ambulatory Visit | Attending: Neurosurgery

## 2021-10-11 ENCOUNTER — Encounter: Payer: Self-pay | Admitting: Neurosurgery

## 2021-10-11 ENCOUNTER — Ambulatory Visit: Payer: 59 | Admitting: Certified Registered"

## 2021-10-11 DIAGNOSIS — G894 Chronic pain syndrome: Secondary | ICD-10-CM | POA: Diagnosis present

## 2021-10-11 DIAGNOSIS — F419 Anxiety disorder, unspecified: Secondary | ICD-10-CM | POA: Insufficient documentation

## 2021-10-11 DIAGNOSIS — K219 Gastro-esophageal reflux disease without esophagitis: Secondary | ICD-10-CM | POA: Diagnosis not present

## 2021-10-11 DIAGNOSIS — I10 Essential (primary) hypertension: Secondary | ICD-10-CM | POA: Insufficient documentation

## 2021-10-11 DIAGNOSIS — Z419 Encounter for procedure for purposes other than remedying health state, unspecified: Secondary | ICD-10-CM

## 2021-10-11 DIAGNOSIS — E785 Hyperlipidemia, unspecified: Secondary | ICD-10-CM | POA: Diagnosis not present

## 2021-10-11 DIAGNOSIS — G4730 Sleep apnea, unspecified: Secondary | ICD-10-CM | POA: Diagnosis not present

## 2021-10-11 DIAGNOSIS — F32A Depression, unspecified: Secondary | ICD-10-CM | POA: Diagnosis not present

## 2021-10-11 DIAGNOSIS — G4700 Insomnia, unspecified: Secondary | ICD-10-CM | POA: Insufficient documentation

## 2021-10-11 HISTORY — PX: THORACIC LAMINECTOMY FOR SPINAL CORD STIMULATOR: SHX6887

## 2021-10-11 LAB — ABO/RH: ABO/RH(D): O POS

## 2021-10-11 SURGERY — THORACIC LAMINECTOMY FOR SPINAL CORD STIMULATOR
Anesthesia: General | Site: Back

## 2021-10-11 MED ORDER — PROPOFOL 10 MG/ML IV BOLUS
INTRAVENOUS | Status: DC | PRN
  Administered 2021-10-11: 200 ug/kg/min via INTRAVENOUS
  Administered 2021-10-11: 150 mg via INTRAVENOUS

## 2021-10-11 MED ORDER — REMIFENTANIL HCL 1 MG IV SOLR
INTRAVENOUS | Status: AC
  Filled 2021-10-11: qty 1000

## 2021-10-11 MED ORDER — CEFAZOLIN SODIUM-DEXTROSE 2-4 GM/100ML-% IV SOLN
INTRAVENOUS | Status: AC
  Filled 2021-10-11: qty 100

## 2021-10-11 MED ORDER — PROPOFOL 1000 MG/100ML IV EMUL
INTRAVENOUS | Status: AC
  Filled 2021-10-11: qty 100

## 2021-10-11 MED ORDER — BUPIVACAINE HCL (PF) 0.5 % IJ SOLN
INTRAMUSCULAR | Status: AC
  Filled 2021-10-11: qty 30

## 2021-10-11 MED ORDER — VANCOMYCIN HCL 1000 MG IV SOLR
INTRAVENOUS | Status: DC | PRN
  Administered 2021-10-11: 1000 mg via INTRAVENOUS

## 2021-10-11 MED ORDER — BUPIVACAINE-EPINEPHRINE (PF) 0.5% -1:200000 IJ SOLN
INTRAMUSCULAR | Status: AC
  Filled 2021-10-11: qty 30

## 2021-10-11 MED ORDER — LACTATED RINGERS IV SOLN: INTRAVENOUS | Status: DC

## 2021-10-11 MED ORDER — APREPITANT 40 MG PO CAPS
40.0000 mg | ORAL_CAPSULE | Freq: Once | ORAL | Status: AC
  Administered 2021-10-11: 40 mg via ORAL

## 2021-10-11 MED ORDER — KETAMINE HCL 50 MG/5ML IJ SOSY
PREFILLED_SYRINGE | INTRAMUSCULAR | Status: AC
  Filled 2021-10-11: qty 5

## 2021-10-11 MED ORDER — LIDOCAINE HCL (CARDIAC) PF 100 MG/5ML IV SOSY
PREFILLED_SYRINGE | INTRAVENOUS | Status: DC | PRN
  Administered 2021-10-11: 100 mg via INTRAVENOUS

## 2021-10-11 MED ORDER — DEXAMETHASONE SODIUM PHOSPHATE 10 MG/ML IJ SOLN
INTRAMUSCULAR | Status: DC | PRN
  Administered 2021-10-11: 10 mg via INTRAVENOUS

## 2021-10-11 MED ORDER — ONDANSETRON HCL 4 MG/2ML IJ SOLN: 4.0000 mg | Freq: Once | INTRAMUSCULAR | Status: DC | PRN

## 2021-10-11 MED ORDER — LACTATED RINGERS IV SOLN: INTRAVENOUS | Status: DC | PRN

## 2021-10-11 MED ORDER — HYDROMORPHONE HCL 1 MG/ML IJ SOLN
INTRAMUSCULAR | Status: AC
  Filled 2021-10-11: qty 1

## 2021-10-11 MED ORDER — SODIUM CHLORIDE (PF) 0.9 % IJ SOLN
INTRAMUSCULAR | Status: AC
  Filled 2021-10-11: qty 20

## 2021-10-11 MED ORDER — SUCCINYLCHOLINE CHLORIDE 200 MG/10ML IV SOSY
PREFILLED_SYRINGE | INTRAVENOUS | Status: DC | PRN
  Administered 2021-10-11: 100 mg via INTRAVENOUS

## 2021-10-11 MED ORDER — SODIUM CHLORIDE (PF) 0.9 % IJ SOLN
INTRAMUSCULAR | Status: DC | PRN
  Administered 2021-10-11: 60 mL

## 2021-10-11 MED ORDER — VANCOMYCIN HCL IN DEXTROSE 1-5 GM/200ML-% IV SOLN: 1000.0000 mg | Freq: Once | INTRAVENOUS | Status: AC

## 2021-10-11 MED ORDER — SEVOFLURANE IN SOLN
RESPIRATORY_TRACT | Status: AC
  Filled 2021-10-11: qty 250

## 2021-10-11 MED ORDER — OXYCODONE-ACETAMINOPHEN 5-325 MG PO TABS: 1.0000 | ORAL_TABLET | Freq: Four times a day (QID) | ORAL | 0 refills | Status: AC | PRN

## 2021-10-11 MED ORDER — CEFAZOLIN SODIUM-DEXTROSE 2-4 GM/100ML-% IV SOLN
2.0000 g | Freq: Once | INTRAVENOUS | Status: AC
  Administered 2021-10-11: 1 g via INTRAVENOUS

## 2021-10-11 MED ORDER — CHLORHEXIDINE GLUCONATE 0.12 % MT SOLN: 15.0000 mL | Freq: Once | OROMUCOSAL | Status: AC

## 2021-10-11 MED ORDER — MIDAZOLAM HCL 2 MG/2ML IJ SOLN
INTRAMUSCULAR | Status: AC
  Filled 2021-10-11: qty 2

## 2021-10-11 MED ORDER — 0.9 % SODIUM CHLORIDE (POUR BTL) OPTIME
TOPICAL | Status: DC | PRN
  Administered 2021-10-11: 1000 mL

## 2021-10-11 MED ORDER — OXYCODONE-ACETAMINOPHEN 5-325 MG PO TABS
ORAL_TABLET | ORAL | Status: AC
  Filled 2021-10-11: qty 1

## 2021-10-11 MED ORDER — SURGIFLO WITH THROMBIN (HEMOSTATIC MATRIX KIT) OPTIME
TOPICAL | Status: DC | PRN
  Administered 2021-10-11: 1 via TOPICAL

## 2021-10-11 MED ORDER — FENTANYL CITRATE (PF) 100 MCG/2ML IJ SOLN
INTRAMUSCULAR | Status: AC
  Administered 2021-10-11: 25 ug via INTRAVENOUS
  Filled 2021-10-11: qty 2

## 2021-10-11 MED ORDER — SENNA 8.6 MG PO TABS: 1.0000 | ORAL_TABLET | Freq: Every day | ORAL | 0 refills | Status: DC | PRN

## 2021-10-11 MED ORDER — REMIFENTANIL HCL 1 MG IV SOLR
INTRAVENOUS | Status: DC | PRN
  Administered 2021-10-11: .05 ug/kg/min via INTRAVENOUS

## 2021-10-11 MED ORDER — CHLORHEXIDINE GLUCONATE 0.12 % MT SOLN
OROMUCOSAL | Status: AC
  Administered 2021-10-11: 15 mL via OROMUCOSAL
  Filled 2021-10-11: qty 15

## 2021-10-11 MED ORDER — PHENYLEPHRINE HCL-NACL 20-0.9 MG/250ML-% IV SOLN
INTRAVENOUS | Status: DC | PRN
  Administered 2021-10-11: 20 ug/min via INTRAVENOUS

## 2021-10-11 MED ORDER — HYDROMORPHONE HCL 1 MG/ML IJ SOLN
INTRAMUSCULAR | Status: DC | PRN
Start: 2021-10-11 — End: 2021-10-11
  Administered 2021-10-11: 1 mg via INTRAVENOUS

## 2021-10-11 MED ORDER — BUPIVACAINE-EPINEPHRINE (PF) 0.5% -1:200000 IJ SOLN
INTRAMUSCULAR | Status: DC | PRN
  Administered 2021-10-11: 9 mL

## 2021-10-11 MED ORDER — ORAL CARE MOUTH RINSE: 15.0000 mL | Freq: Once | OROMUCOSAL | Status: AC

## 2021-10-11 MED ORDER — MIDAZOLAM HCL 2 MG/2ML IJ SOLN
INTRAMUSCULAR | Status: DC | PRN
  Administered 2021-10-11: 2 mg via INTRAVENOUS

## 2021-10-11 MED ORDER — APREPITANT 40 MG PO CAPS
ORAL_CAPSULE | ORAL | Status: AC
  Filled 2021-10-11: qty 1

## 2021-10-11 MED ORDER — VANCOMYCIN HCL IN DEXTROSE 1-5 GM/200ML-% IV SOLN
INTRAVENOUS | Status: AC
  Administered 2021-10-11: 1000 mg via INTRAVENOUS
  Filled 2021-10-11: qty 200

## 2021-10-11 MED ORDER — OXYCODONE-ACETAMINOPHEN 5-325 MG PO TABS
1.0000 | ORAL_TABLET | Freq: Once | ORAL | Status: AC
  Administered 2021-10-11: 1 via ORAL

## 2021-10-11 MED ORDER — FENTANYL CITRATE (PF) 100 MCG/2ML IJ SOLN
25.0000 ug | INTRAMUSCULAR | Status: DC | PRN
  Administered 2021-10-11: 25 ug via INTRAVENOUS

## 2021-10-11 MED ORDER — KETAMINE HCL 10 MG/ML IJ SOLN
INTRAMUSCULAR | Status: DC | PRN
  Administered 2021-10-11: 50 mg via INTRAVENOUS

## 2021-10-11 MED ORDER — ONDANSETRON HCL 4 MG/2ML IJ SOLN
INTRAMUSCULAR | Status: DC | PRN
  Administered 2021-10-11: 4 mg via INTRAVENOUS

## 2021-10-11 SURGICAL SUPPLY — 70 items
ADH SKN CLS APL DERMABOND .7 (GAUZE/BANDAGES/DRESSINGS) ×2
AGENT HMST KT MTR STRL THRMB (HEMOSTASIS) ×1
APL PRP STRL LF DISP 70% ISPRP (MISCELLANEOUS) ×2
BUR NEURO DRILL SOFT 3.0X3.8M (BURR) ×2 IMPLANT
CHLORAPREP W/TINT 26 (MISCELLANEOUS) ×4 IMPLANT
CNTNR SPEC 2.5X3XGRAD LEK (MISCELLANEOUS) ×1
CONT SPEC 4OZ STER OR WHT (MISCELLANEOUS) ×1
CONT SPEC 4OZ STRL OR WHT (MISCELLANEOUS) ×1
CONTAINER SPEC 2.5X3XGRAD LEK (MISCELLANEOUS) IMPLANT
CONTROL REMOTE FREELINK ALPHA (NEUROSURGERY SUPPLIES) ×1 IMPLANT
COUNTER NEEDLE 20/40 LG (NEEDLE) ×2 IMPLANT
CUP MEDICINE 2OZ PLAST GRAD ST (MISCELLANEOUS) ×2 IMPLANT
DERMABOND ADVANCED (GAUZE/BANDAGES/DRESSINGS) ×2
DERMABOND ADVANCED .7 DNX12 (GAUZE/BANDAGES/DRESSINGS) ×2 IMPLANT
DRAPE C ARM PK CFD 31 SPINE (DRAPES) ×2 IMPLANT
DRAPE LAPAROTOMY 100X77 ABD (DRAPES) ×2 IMPLANT
DRAPE SURG 17X11 SM STRL (DRAPES) ×12 IMPLANT
DRSG OPSITE POSTOP 4X6 (GAUZE/BANDAGES/DRESSINGS) ×2 IMPLANT
DRSG OPSITE POSTOP 4X8 (GAUZE/BANDAGES/DRESSINGS) ×2 IMPLANT
ELECT CAUTERY BLADE TIP 2.5 (TIP) ×2
ELECT REM PT RETURN 9FT ADLT (ELECTROSURGICAL) ×2
ELECTRODE CAUTERY BLDE TIP 2.5 (TIP) ×1 IMPLANT
ELECTRODE REM PT RTRN 9FT ADLT (ELECTROSURGICAL) ×1 IMPLANT
ELEVATER PASSER (SPINAL CORD STIMULATOR) ×2
FEE INTRAOP CADWELL SUPPLY NCS (MISCELLANEOUS) ×1 IMPLANT
FEE INTRAOP MONITOR IMPULS NCS (MISCELLANEOUS) IMPLANT
GAUZE 4X4 16PLY ~~LOC~~+RFID DBL (SPONGE) ×2 IMPLANT
GLOVE SURG SYN 6.5 ES PF (GLOVE) ×8 IMPLANT
GLOVE SURG SYN 6.5 PF PI (GLOVE) ×1 IMPLANT
GLOVE SURG SYN 8.5  E (GLOVE) ×3
GLOVE SURG SYN 8.5 E (GLOVE) ×3 IMPLANT
GLOVE SURG SYN 8.5 PF PI (GLOVE) ×3 IMPLANT
GLOVE SURG UNDER POLY LF SZ6.5 (GLOVE) ×4 IMPLANT
GOWN SRG LRG LVL 4 IMPRV REINF (GOWNS) ×1 IMPLANT
GOWN SRG XL LVL 3 NONREINFORCE (GOWNS) ×1 IMPLANT
GOWN STRL NON-REIN TWL XL LVL3 (GOWNS) ×2
GOWN STRL REIN LRG LVL4 (GOWNS) ×2
GRADUATE 1200CC STRL 31836 (MISCELLANEOUS) ×2 IMPLANT
INTRAOP CADWELL SUPPLY FEE NCS (MISCELLANEOUS) ×1
INTRAOP DISP SUPPLY FEE NCS (MISCELLANEOUS) ×2
INTRAOP MONITOR FEE IMPULS NCS (MISCELLANEOUS) ×1
INTRAOP MONITOR FEE IMPULSE (MISCELLANEOUS) ×1
KIT CHARGING (KITS) ×1
KIT CHARGING PRECISION NEURO (KITS) IMPLANT
KIT IPG ALPHA WAVEWRITER (Stimulator) ×1 IMPLANT
KIT SPINAL PRONEVIEW (KITS) ×2 IMPLANT
KIT TURNOVER KIT A (KITS) ×2 IMPLANT
LEAD COVER EDGE 50CM STIM KIT (Lead) ×1 IMPLANT
MANIFOLD NEPTUNE II (INSTRUMENTS) ×2 IMPLANT
MARKER SKIN DUAL TIP RULER LAB (MISCELLANEOUS) ×4 IMPLANT
NDL SAFETY ECLIPSE 18X1.5 (NEEDLE) ×1 IMPLANT
NEEDLE HYPO 18GX1.5 SHARP (NEEDLE) ×2
NEEDLE HYPO 22GX1.5 SAFETY (NEEDLE) ×2 IMPLANT
NS IRRIG 1000ML POUR BTL (IV SOLUTION) ×2 IMPLANT
PACK LAMINECTOMY NEURO (CUSTOM PROCEDURE TRAY) ×2 IMPLANT
PASSER ELEVATOR (SPINAL CORD STIMULATOR) IMPLANT
STAPLER SKIN PROX 35W (STAPLE) ×2 IMPLANT
SURGIFLO W/THROMBIN 8M KIT (HEMOSTASIS) ×2 IMPLANT
SUT DVC VLOC 3-0 CL 6 P-12 (SUTURE) ×2 IMPLANT
SUT SILK 2 0SH CR/8 30 (SUTURE) ×2 IMPLANT
SUT V-LOC 90 ABS DVC 3-0 CL (SUTURE) ×1 IMPLANT
SUT VIC AB 0 CT1 18XCR BRD 8 (SUTURE) ×1 IMPLANT
SUT VIC AB 0 CT1 8-18 (SUTURE) ×2
SUT VIC AB 2-0 CT1 18 (SUTURE) ×3 IMPLANT
SYR 10ML LL (SYRINGE) ×2 IMPLANT
SYR 30ML LL (SYRINGE) ×4 IMPLANT
TOOL LONG TUNNEL (SPINAL CORD STIMULATOR) ×1 IMPLANT
TOWEL OR 17X26 4PK STRL BLUE (TOWEL DISPOSABLE) ×6 IMPLANT
TUBING CONNECTING 10 (TUBING) ×2 IMPLANT
WATER STERILE IRR 500ML POUR (IV SOLUTION) ×1 IMPLANT

## 2021-10-11 NOTE — Op Note (Signed)
Indications: the patient is a 66 yo female who was diagnosed with G89.4- Chronic Pain Syndrome. The patient had a successful trial for spinal cord stimulation, so was consented for placement of a permanent device ?  ?Findings: successful placement of a [employer] spinal cord stimulator.   ?Preoperative Diagnosis: G89.4- Chronic Pain Syndrome ?Postoperative Diagnosis: same ?  ?  ?EBL: 30 ml ?IVF: see AR ml ?Drains: none ?Disposition: Extubated and Stable to PACU ?Complications: none ?  ?No foley catheter was placed. ?  ?  ?Preoperative Note:  ?  ?Risks of surgery discussed in clinic. ?  ?Operative Note:  ?  ?  ?The patient was then brought from the preoperative center with intravenous access established.  The patient underwent general anesthesia and endotracheal tube intubation, then was rotated on the [facility] table where all pressure points were appropriately padded.  An incision was marked with flouroscopy at T8/9, and on the right flank. The skin was then thoroughly cleansed.  Perioperative antibiotic prophylaxis was administered.  Sterile prep and drapes were then applied and a timeout was then observed.   ?  ?Once this was complete an incision was opened with the use of a #10 blade knife in the midline at the thoracic incision.  The paraspinus muscled were subperiosteally dissected until the laminae of T9 and T10 were visualized. Flouroscopy was used to confirm the level. A self-retaining retractor was placed. ?  ?The rongeur was used to remove the spinous process of T9.  The drill was used to thin the bone until the ligamentum flavum was visualized.  The ligamentum was then removed and the dura visualized. This was widened until placement of the paddle lead was possible.   ?  ?The lead was then advanced to the T7/8 disc space at the top of the lead.  The lead was secured with a 2-0 silk suture.  ? ?  ?The incision on the flank was then opened and a pocket formed until it was large enough for the pulse  generator.  The tunneler was used to connect between the pocket and the incision.  The lead was inserted into the tunneler and tunneled to the buttock.  The leads were attached to the IPG and impedances checked.  The leads were then tightened.  The IPG was then inserted into the pouch. ?  ?Both sites were irrigated.  The wounds were closed in layers with 0 and 2-0 vicryl.  The skin was approximated with monocryl. A sterile dressing was applied. ?  ?Monitoring was stable throughout. ?  ?Patient was then rotated back to the preoperative bed awakened from anesthesia and taken to recovery all counts are correct in this case. ?  ?I performed the entire procedure with the assistance of Manning Charity PA as an Designer, television/film set. ?  ?  ?Venetia Night MD  ?

## 2021-10-11 NOTE — Discharge Summary (Signed)
Physician Discharge Summary  ?Patient ID: ?Elizabeth Mann ?MRN: [id number] ?DOB/AGE: 64-Jan-1957 64 y.o. ? ?Admit date: 10/11/2021 ?Discharge date: 10/11/2021 ? ?Admission Diagnoses: chronic pain syndrome  ? ?Discharge Diagnoses:  ?Active Problems: ?  * No active hospital problems. * ? ? ?Discharged Condition: good ? ?Hospital Course:  ?Elizabeth Mann is a 66 y.o s/p placement of SCS pulse generator and paddle. Her interoperative course was uncomplicated. She was monitored in PACU and discharged home after ambulating, urinating, and tolerating PO intake. She was given prescriptions for Percocet and Senna.  ? ?Consults: None ? ?Significant Diagnostic Studies: none ? ?Treatments: surgery: as above. Please see separately dictated operative report for further details  ? ?Discharge Exam: ?Blood pressure (!) 142/63, pulse 64, temperature 98.6 ?F (37 ?C), resp. rate 18, height 5\' 2"  (1.575 m), weight 81.6 kg, SpO2 99 %. ?CN II-XII grossly intact ?MAEW ?Incisions c/d/i ? ?Disposition: Discharge disposition: 01-Home or Self Care ? ? ? ? ? ? ? ?Allergies as of 10/11/2021   ? ?   Reactions  ? Contrast Media [iodinated Contrast Media] Hives  ? Per patient, the healthcare providers were not sure if the oral contrast or the IV contrast causes hives/ rashes/itching.   ? ?  ? ?  ?Medication List  ?  ? ?STOP taking these medications   ? ?aspirin EC 81 MG tablet ?  ? ?  ? ?TAKE these medications   ? ?atorvastatin 10 MG tablet ?Commonly known as: LIPITOR ?Take 10 mg by mouth daily. ?  ?buPROPion ER 100 MG 12 hr tablet ?Commonly known as: WELLBUTRIN SR ?Take 100 mg by mouth 2 (two) times daily. ?  ?Calcium Carbonate-Vitamin D 600-400 MG-UNIT tablet ?Take 1 tablet by mouth daily. ?  ?escitalopram 10 MG tablet ?Commonly known as: LEXAPRO ?Take 10 mg by mouth daily. ?  ?estradiol 0.5 MG tablet ?Commonly known as: ESTRACE ?Take 0.5 mg by mouth daily. ?  ?fluticasone 50 MCG/ACT nasal spray ?Commonly known as: FLONASE ?Place 1 spray into both  nostrils daily. ?  ?gabapentin 300 MG capsule ?Commonly known as: NEURONTIN ?Take 300 mg by mouth at bedtime. ?  ?oxyCODONE-acetaminophen 5-325 MG tablet ?Commonly known as: Percocet ?Take 1 tablet by mouth every 6 (six) hours as needed for up to 5 days for severe pain. ?  ?pantoprazole 40 MG tablet ?Commonly known as: PROTONIX ?Take 40 mg by mouth daily. ?  ?senna 8.6 MG Tabs tablet ?Commonly known as: SENOKOT ?Take 1 tablet (8.6 mg total) by mouth daily as needed for mild constipation. ?  ?tiZANidine 4 MG tablet ?Commonly known as: Zanaflex ?Take 1 tablet (4 mg total) by mouth every 12 (twelve) hours as needed for muscle spasms. ?What changed:  ?when to take this ?additional instructions ?  ?traZODone 100 MG tablet ?Commonly known as: DESYREL ?Take 100 mg by mouth at bedtime. ?  ?valACYclovir 1000 MG tablet ?Commonly known as: VALTREX ?Take 1,000 mg by mouth daily as needed (Cold sore). ?  ?vitamin B-12 1000 MCG tablet ?Commonly known as: CYANOCOBALAMIN ?Take 1,000 mcg by mouth daily. ?  ?Vitamin D3 125 MCG (5000 UT) Caps ?Take 5,000 Units by mouth daily. ?  ? ?  ? ? Follow-up Information   ? ? 10/13/2021, PA Follow up in 2 week(s).   ?Why: for post-op f/u. This appointment should be in your pre-op paperwork. Please call the office with any questions or concerns ?Contact information: ?[address] ?[place] ?[phone] ? ? ?  ?  ? ?  ?  ? ?  ? ? ?  Signed: ?Susanne Borders ?10/11/2021, 9:20 AM ? ? ?

## 2021-10-11 NOTE — Anesthesia Postprocedure Evaluation (Signed)
Anesthesia Post Note ? ?Patient: Elizabeth Mann ? ?Procedure(s) Performed: THORACIC LAMINECTOMY FOR SPINAL CORD STIMULATOR ([employer]) (Back) ? ?Patient location during evaluation: PACU ?Anesthesia Type: General ?Level of consciousness: awake and oriented ?Pain management: pain level controlled ?Vital Signs Assessment: post-procedure vital signs reviewed and stable ?Respiratory status: spontaneous breathing and nonlabored ventilation ?Cardiovascular status: stable ?Anesthetic complications: no ? ? ?No notable events documented. ? ? ?Last Vitals:  ?Vitals:  ? 10/11/21 0711  ?BP: (!) 142/63  ?Pulse: 64  ?Resp: 18  ?Temp: 37 ?C  ?SpO2: 99%  ?  ?Last Pain: There were no vitals filed for this visit. ? ?  ?  ?  ?  ?  ?  ? ?VAN STAVEREN,Nilesh Stegall ? ? ? ? ?

## 2021-10-11 NOTE — Discharge Instructions (Addendum)
NEUROSURGERY DISCHARGE INSTRUCTIONS ? ?Admission diagnosis: G89.4- Chronic Pain Syndrome ? ?Operative procedure: Placement of SCS ? ?What to do after you leave the hospital: ? ?Recommended diet: regular diet. Increase protein intake to promote wound healing. ? ?Recommended activity: no lifting, driving, or strenuous exercise for 4 weeks . You should walk multiple times per day ? ?Special Instructions ? ?No straining, no heavy lifting > 10lbs x 4 weeks.  Keep incision area clean and dry. May shower in 2 days. No baths or pools for 6 weeks.  ?Please remove dressing tomorrow, no need to apply a bandage afterwards ? ?You have no sutures to remove, the skin is closed with adhesive. Please remove bandage 2-3 days after surgery ? ?Hold home Aspirin for 14 days post-op ? ?Please take pain medications as directed. Take a stool softener if on pain medications ? ? ?Please Report any of the following: ?Nausea or Vomiting, Temperature is greater than 101.47F (38.1C) degrees, Dizziness, Abdominal Pain, Difficulty Breathing or Shortness of Breath, Inability to Eat, drink Fluids, or Take medications, Bleeding, swelling, or drainage from surgical incision sites, New numbness or weakness, and Bowel or bladder dysfunction to the neurosurgeon on call at [phone] ? ?Additional Follow up appointments ?Please follow up with Manning Charity PA-C in [place] [facility] as scheduled in 2-3 weeks ? ? ?Please see below for scheduled appointments: ? ?No future appointments. ? ? AMBULATORY SURGERY  ?DISCHARGE INSTRUCTIONS ? ? ?The drugs that you were given will stay in your system until tomorrow so for the next 24 hours you should not: ? ?Drive an automobile ?Make any legal decisions ?Drink any alcoholic beverage ? ? ?You may resume regular meals tomorrow.  Today it is better to start with liquids and gradually work up to solid foods. ? ?You may eat anything you prefer, but it is better to start with liquids, then soup and crackers, and gradually  work up to solid foods. ? ? ?Please notify your doctor immediately if you have any unusual bleeding, trouble breathing, redness and pain at the surgery site, drainage, fever, or pain not relieved by medication. ? ? ? ?Additional Instructions: ? ? ? ? ? ? ? ?Please contact your physician with any problems or Same Day Surgery at [phone], Monday through Friday 6 am to 4 pm, or [place] at [facility] number at [phone].  ?

## 2021-10-11 NOTE — Anesthesia Procedure Notes (Signed)
Procedure Name: Intubation ?Date/Time: 10/11/2021 7:26 AM ?Performed by: Beverely Low, CRNA ?Pre-anesthesia Checklist: Patient identified, Patient being monitored, Timeout performed, Emergency Drugs available and Suction available ?Patient Re-evaluated:Patient Re-evaluated prior to induction ?Oxygen Delivery Method: Circle system utilized ?Preoxygenation: Pre-oxygenation with 100% oxygen ?Induction Type: IV induction ?Ventilation: Mask ventilation without difficulty ?Laryngoscope Size: 3 and McGraph ?Grade View: Grade I ?Tube type: Oral ?Tube size: 7.0 mm ?Number of attempts: 1 ?Placement Confirmation: ETT inserted through vocal cords under direct vision, positive ETCO2 and breath sounds checked- equal and bilateral ?Secured at: 21 cm ?Tube secured with: Tape ?Dental Injury: Teeth and Oropharynx as per pre-operative assessment  ? ? ? ? ?

## 2021-10-11 NOTE — Anesthesia Preprocedure Evaluation (Signed)
Anesthesia Evaluation  ?Patient identified by MRN, date of birth, ID band ?Patient awake ? ? ? ?Reviewed: ?Allergy & Precautions, NPO status , Patient's Chart, lab work & pertinent test results ? ?Airway ?Mallampati: II ? ?TM Distance: >3 FB ?Neck ROM: full ? ? ? Dental ? ?(+) Upper Dentures ?  ?Pulmonary ?neg pulmonary ROS, sleep apnea and Continuous Positive Airway Pressure Ventilation ,  ?  ?Pulmonary exam normal ?breath sounds clear to auscultation ? ? ? ? ? ? Cardiovascular ?Exercise Tolerance: Good ?hypertension, negative cardio ROS ?Normal cardiovascular exam ?Rhythm:Regular Rate:Normal ? ? ?  ?Neuro/Psych ? Headaches, Anxiety Depression negative neurological ROS ? negative psych ROS  ? GI/Hepatic ?negative GI ROS, Neg liver ROS, GERD  ,  ?Endo/Other  ?negative endocrine ROS ? Renal/GU ?negative Renal ROS  ?negative genitourinary ?  ?Musculoskeletal ? ? Abdominal ?Normal abdominal exam  (+)   ?Peds ?negative pediatric ROS ?(+)  Hematology ?negative hematology ROS ?(+)   ?Anesthesia Other Findings ?Past Medical History: ?No date: Anxiety ?No date: Depression ?No date: GERD (gastroesophageal reflux disease) ?No date: Hyperlipidemia ?No date: Hypertension ?No date: Insomnia ?No date: Sleep apnea ? ?Past Surgical History: ?No date: ABDOMINAL HYSTERECTOMY ?No date: BREAST BIOPSY; Right ?    Comment:  neg ?No date: OOPHORECTOMY ? ? ? ? Reproductive/Obstetrics ?negative OB ROS ? ?  ? ? ? ? ? ? ? ? ? ? ? ? ? ?  ?  ? ? ? ? ? ? ? ? ?Anesthesia Physical ?Anesthesia Plan ? ?ASA: 2 ? ?Anesthesia Plan: General  ? ?Post-op Pain Management:   ? ?Induction: Intravenous ? ?PONV Risk Score and Plan: 1 and Ondansetron and Dexamethasone ? ?Airway Management Planned: Oral ETT ? ?Additional Equipment:  ? ?Intra-op Plan:  ? ?Post-operative Plan: Extubation in OR ? ?Informed Consent: I have reviewed the patients History and Physical, chart, labs and discussed the procedure including the risks,  benefits and alternatives for the proposed anesthesia with the patient or authorized representative who has indicated his/her understanding and acceptance.  ? ? ? ?Dental Advisory Given ? ?Plan Discussed with: CRNA and Surgeon ? ?Anesthesia Plan Comments:   ? ? ? ? ? ? ?Anesthesia Quick Evaluation ? ?

## 2021-10-11 NOTE — Transfer of Care (Signed)
Immediate Anesthesia Transfer of Care Note ? ?Patient: Elizabeth Mann ? ?Procedure(s) Performed: THORACIC LAMINECTOMY FOR SPINAL CORD STIMULATOR ([employer]) (Back) ? ?Patient Location: PACU ? ?Anesthesia Type:General ? ?Level of Consciousness: drowsy ? ?Airway & Oxygen Therapy: Patient Spontanous Breathing and Patient connected to face mask oxygen ? ?Post-op Assessment: Report given to RN and Post -op Vital signs reviewed and stable ? ?Post vital signs: Reviewed and stable ? ?Last Vitals:  ?Vitals Value Taken Time  ?BP 140/66 10/11/21 0945  ?Temp 36.1 ?C 10/11/21 0938  ?Pulse 64 10/11/21 0947  ?Resp 10 10/11/21 0947  ?SpO2 100 % 10/11/21 0947  ?Vitals shown include unvalidated device data. ? ?Last Pain:  ?Vitals:  ? 10/11/21 0938  ?PainSc: Asleep  ?   ? ?Patients Stated Pain Goal: 0 (10/11/21 0711) ? ?Complications: No notable events documented. ?

## 2021-10-11 NOTE — H&P (Signed)
History of Present Illness: ?10/11/2021 ?Elizabeth Mann presents today with continued pain after a successful stimulator trial. ? ?09/08/2021 ?Elizabeth Mann is here today with a chief complaint of low back pain and left anterior leg pain. She has been seen in the pain clinic and had a spinal cord stimulator evaluation which was successful. She is felt to be a good candidate by her psychologist. ? ?She has been having back and leg pain since 2017. She has tried substantial conservative management as noted below. None of this has helped her significantly. ? ?Bowel/Bladder Dysfunction: none ? ?Conservative measures: TENS unit ?Physical therapy: has participated in 2022 at [facility] ?Multimodal medical therapy including regular antiinflammatories: gabapentin, methocarbamol, tizanidine, ibuprofen, etodolac, tramadol, prednisone ?Injections: has received epidural steroid injections ?04/13/2021: Bilateral L5-S1 transforaminal ESI (no relief, no contrast) ?02/03/2021: Bilateral L5 trigger point injections (mild benefit) ?01/12/2021: Bilateral sacroiliac joint injection (no benefit, no contrast) ?12/03/2020: Bilateral L4-5 transforaminal ESI (no contrast, complete relief of right lower extremity pain continued left lower extremity pain) ?05 08/2017: Bilateral L4-5 transforaminal ESI (good relief x1 week)  ?10/04/2017: Left L4-5 transfemoral ESI (good relief times 3 weeks) ?08/23/2017: Left L4-5 transfemoral ESI (good relief times 3 weeks) ?10/08/15: Left L5-S1 transforaminal ESI (moderate relief for less than 1 week) ?09/25/15: Left superior gluteal trigger point injection (no relief) ?12/26/2014: Bilateral sacroiliac joint injection (good relief) ?11/07/2014: Bilateral L5-S1 transforaminal ESI without contrast (mild to moderate relief) ?10/17/2014: Right L5-S1 transforaminal ESI without contrast ? ?Past Surgery: none ? ?Elizabeth Mann has no symptoms of cervical myelopathy. ? ?The symptoms are causing a significant impact on  the patient's life.  ? ?Review of Systems:  ?A 10 point review of systems is negative, except for the pertinent positives and negatives detailed in the HPI. ? ?Past Medical History: ?Past Medical History:  ?Diagnosis Date  ? Arthritis  ? Chickenpox  ? Colon polyp 10/04/2011  ?adenoma, hyperplastic polyp  ? Diverticulosis 09/25/2014  ?sigmoid colon and distal descending colon  ? GERD (gastroesophageal reflux disease)  ? HA (headache)  ? HLD (hyperlipidemia)  ? Osteopenia  ? Osteopenia  ? Osteoporosis, post-menopausal  ? Sleep apnea  ? ?Past Surgical History: ?Past Surgical History:  ?Procedure Laterality Date  ? COLONOSCOPY 10/04/2011  ?Dr. Eber Hong @ [facility] - Adenomatous Polyp  ? UPPER GASTROINTESTINAL ENDOSCOPY 03/30/2012  ? COLONOSCOPY 09/25/2014  ?Dr. Eber Hong @ [facility] - [facility]ps, [facility], FHPolyps, 5 yr rpt per [facility]  ? COLONOSCOPY 11/18/2019  ?Perianal skin tags/Internal hemorrhoids/PHx CP/Repeat 71yrs/[facility]  ? HYSTERECTOMY  ? PLANTAR FASCIECTOMY Left  ? Right Elbow Surgery Right  ?years ago  ? TAH / BSO  ? ? ?Allergies  ?Allergen Reactions  ? Contrast Media [Iodinated Contrast Media] Hives  ?  Per patient, the healthcare providers were not sure if the oral contrast or the IV contrast causes hives/ rashes/itching.   ? ? ?Current Meds  ?Medication Sig  ? aspirin EC 81 MG tablet Take 81 mg by mouth daily.  ? atorvastatin (LIPITOR) 10 MG tablet Take 10 mg by mouth daily.  ? buPROPion ER (WELLBUTRIN SR) 100 MG 12 hr tablet Take 100 mg by mouth 2 (two) times daily.  ? Calcium Carbonate-Vitamin D 600-400 MG-UNIT tablet Take 1 tablet by mouth daily.  ? Cholecalciferol (VITAMIN D3) 125 MCG (5000 UT) CAPS Take 5,000 Units by mouth daily.  ? escitalopram (LEXAPRO) 10 MG tablet Take 10 mg by mouth daily.  ? estradiol (ESTRACE) 0.5 MG tablet Take 0.5 mg by mouth daily.  ?  fluticasone (FLONASE) 50 MCG/ACT nasal spray Place 1 spray into both nostrils daily.  ? gabapentin (NEURONTIN) 300 MG capsule Take 300 mg by mouth at  bedtime.  ? pantoprazole (PROTONIX) 40 MG tablet Take 40 mg by mouth daily.  ? tiZANidine (ZANAFLEX) 4 MG tablet Take 1 tablet (4 mg total) by mouth every 12 (twelve) hours as needed for muscle spasms. (Patient taking differently: Take 4 mg by mouth at bedtime. Additional 4 mg if needed during day)  ? traZODone (DESYREL) 100 MG tablet Take 100 mg by mouth at bedtime.  ? valACYclovir (VALTREX) 1000 MG tablet Take 1,000 mg by mouth daily as needed (Cold sore).  ? vitamin B-12 (CYANOCOBALAMIN) 1000 MCG tablet Take 1,000 mcg by mouth daily.  ? ? ?Social History: ?Social History  ? ?Tobacco Use  ? Smoking status: Never  ? Smokeless tobacco: Never  ?Vaping Use  ? Vaping Use: Never used  ?Substance Use Topics  ? Alcohol use: No  ?Alcohol/week: 0.0 standard drinks  ? Drug use: No  ? ?Family Medical History: ?Family History  ?Problem Relation Age of Onset  ? Osteoporosis (Thinning of bones) Mother  ? Colon cancer Father 68  ? Stroke Father  ? Rheum arthritis Father  ? Colon cancer Maternal Uncle  ? Colon cancer Maternal Uncle  ? Stroke Maternal Grandmother  ? Stroke Maternal Grandfather  ? ?Physical Examination: ?There were no vitals filed for this visit. ? ?Heart sounds normal no MRG. Chest Clear to Auscultation Bilaterally. ? ?General: Patient is well developed, well nourished, calm, collected, and in no apparent distress. Attention to examination is appropriate. ? ?Psychiatric: Patient is non-anxious. ? ?Head: Pupils equal, round, and reactive to light. ? ?ENT: Oral mucosa appears well hydrated. ? ?Neck: Supple. Full range of motion. ? ?Respiratory: Patient is breathing without any difficulty. ? ?Extremities: No edema. ? ?Vascular: Palpable dorsal pedal pulses. ? ?Skin: On exposed skin, there are no abnormal skin lesions. ? ?NEUROLOGICAL:  ? ?Awake, alert, oriented to person, place, and time. Speech is clear and fluent. Fund of knowledge is appropriate.  ? ?Cranial Nerves: Pupils equal round and reactive to light. Facial  tone is symmetric. Facial sensation is symmetric. Shoulder shrug is symmetric. Tongue protrusion is midline. There is no pronator drift. ? ?Strength: ?Side Biceps Triceps Deltoid Interossei Grip Wrist Ext. Wrist Flex.  ?R 5 5 5 5 5 5 5   ?L 5 5 5 5 5 5 5   ? ?Side Iliopsoas Quads Hamstring PF DF EHL  ?R 5 5 5 5 5 5   ?L 5 5 5 5 5 5   ? ?Reflexes are 1+ and symmetric at the biceps, triceps, brachioradialis, patella and achilles. Hoffman's is absent.  ?Clonus is not present. Toes are down-going.  ?Bilateral upper and lower extremity sensation is intact to light touch.  ? ?Gait is antalgic. ? ?Medical Decision Making ? ?Imaging: ?MRI Thoracic 07/27/21 ?IMPRESSION:  ?Small Schmorl's node indenting the superior T3 endplate and small  ?disc protrusion at T12-L1 without significant spinal canal or neural  ?foraminal stenosis. Otherwise, unremarkable thoracic spine MRI.  ? ?Electronically Signed  ?  By: M.D.  ?  On: 07/27/2021 09:12 ? ?I have personally reviewed the images and agree with the above interpretation. ? ?Assessment and Plan: ?Elizabeth Mann is a pleasant 66 y.o. female with chronic pain syndrome. She had a successful spinal cord stimulator trial. We will proceed with permanent implantation. ? ? ?Lesia Hausen MD, MPHS ?Department of Neurosurgery ?

## 2021-10-11 NOTE — Progress Notes (Signed)
PHARMACY -  BRIEF ANTIBIOTIC NOTE  ? ?Pharmacy has received consult(s) for cefazolin and vancomycin from a surgical provider.  The patient's profile has been reviewed for ht/wt/allergies/indication/available labs.   ? ?One time order(s) placed for  ? ?1) cefazolin 2 grams IV x 1 ? ?2) vancomycin 1000 mg IV x 1 ?      ?                ?Thank you, ?Lowella Bandy ?10/11/2021  6:19 AM ? ?

## 2021-10-12 ENCOUNTER — Encounter: Payer: Self-pay | Admitting: Neurosurgery

## 2021-10-13 ENCOUNTER — Other Ambulatory Visit: Payer: Self-pay | Admitting: Family Medicine

## 2021-10-13 DIAGNOSIS — N644 Mastodynia: Secondary | ICD-10-CM

## 2022-01-27 IMAGING — CR DG LUMBAR SPINE COMPLETE W/ BEND
7 series · 7 of 7 positions shown · non-contrast
Comparison: 01/04/2016, 04/09/2021

CLINICAL DATA: Low back pain

EXAM:
LUMBAR SPINE - COMPLETE WITH BENDING VIEWS

[l-spine ap]
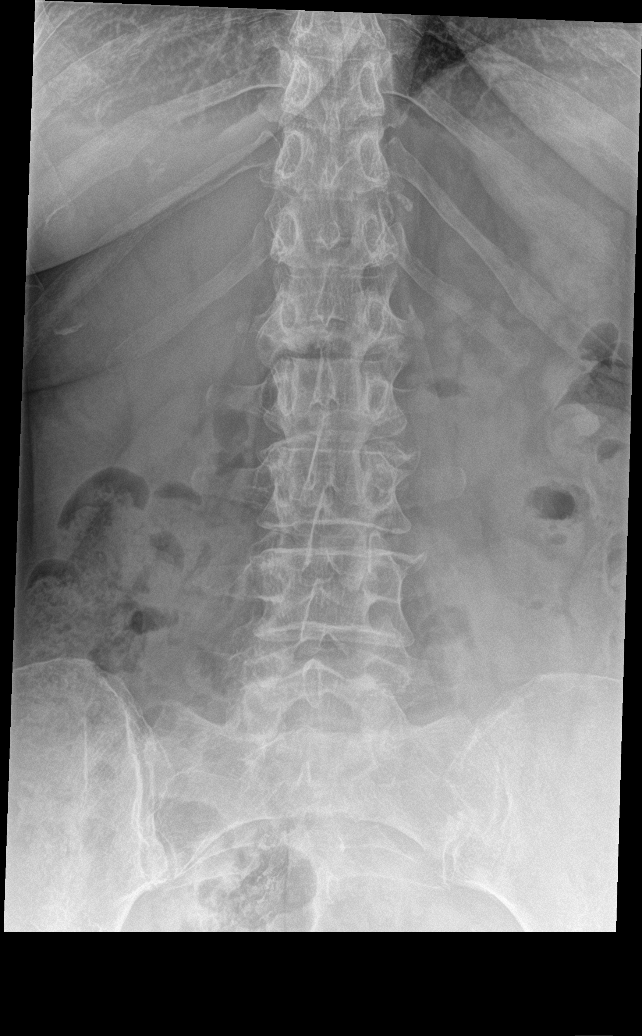

[l-spine obl (1 of 2)]
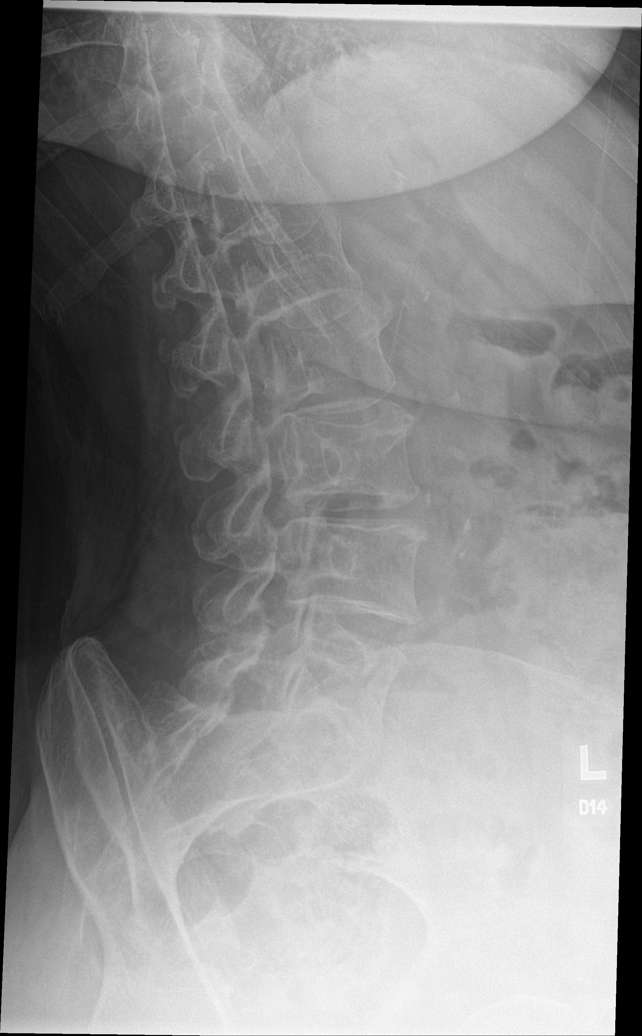

[l-spine obl (2 of 2)]
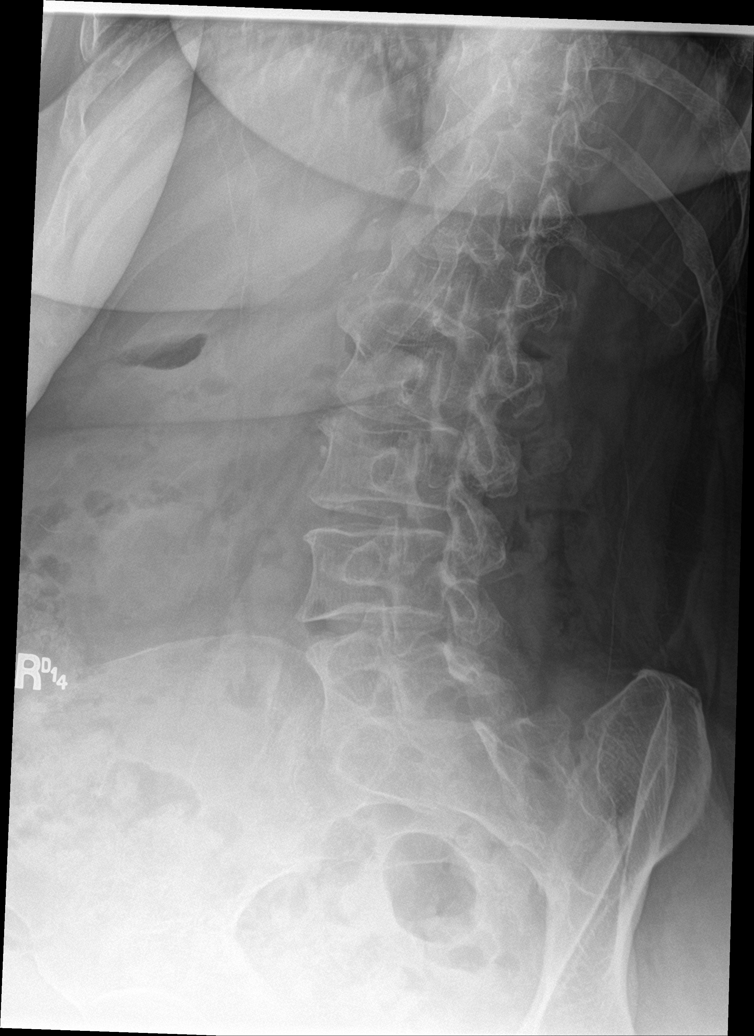

[l-spine lat]
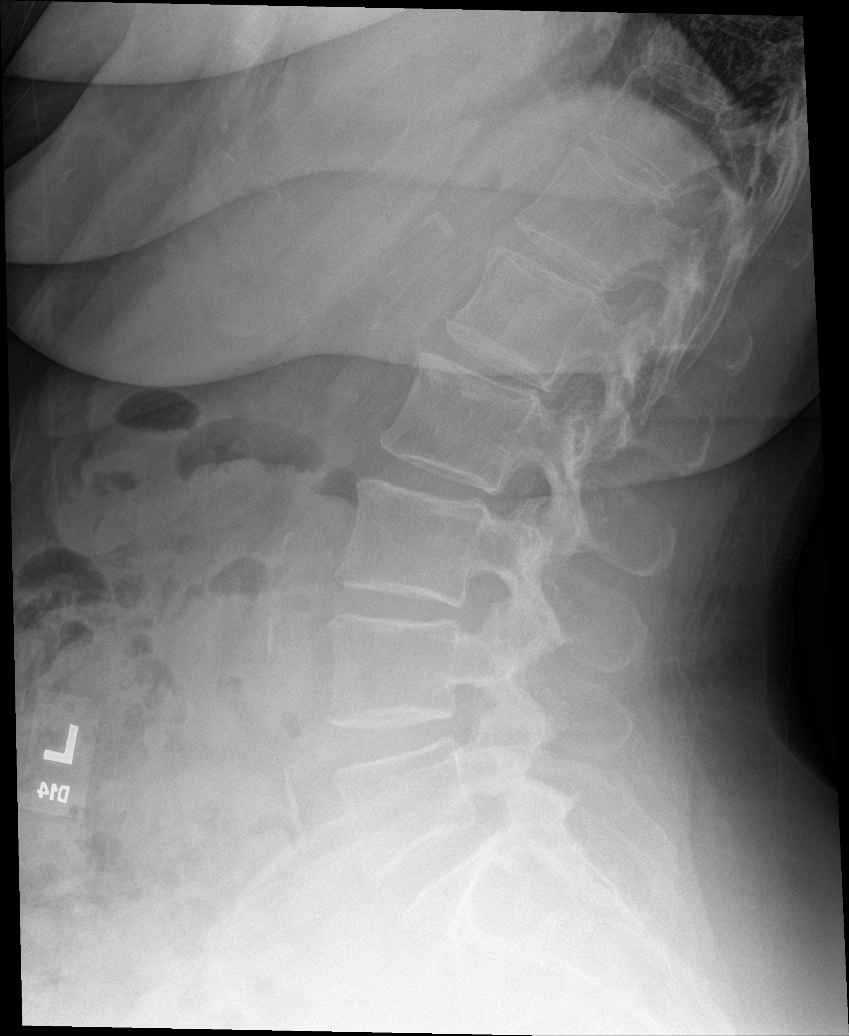

[l-spine flex]
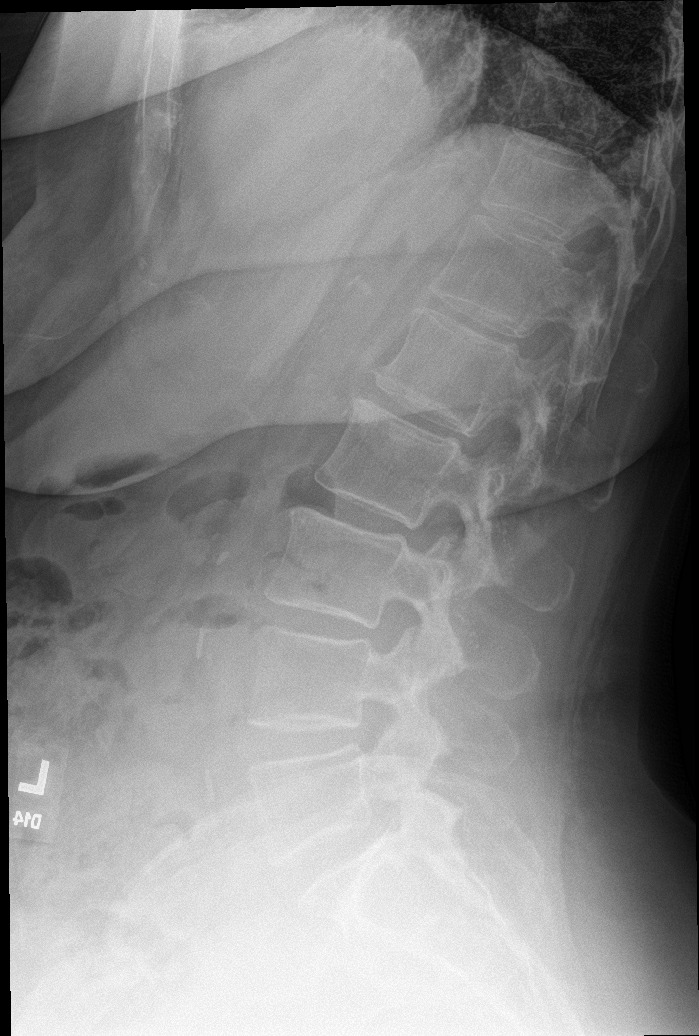

[l-spine ext]
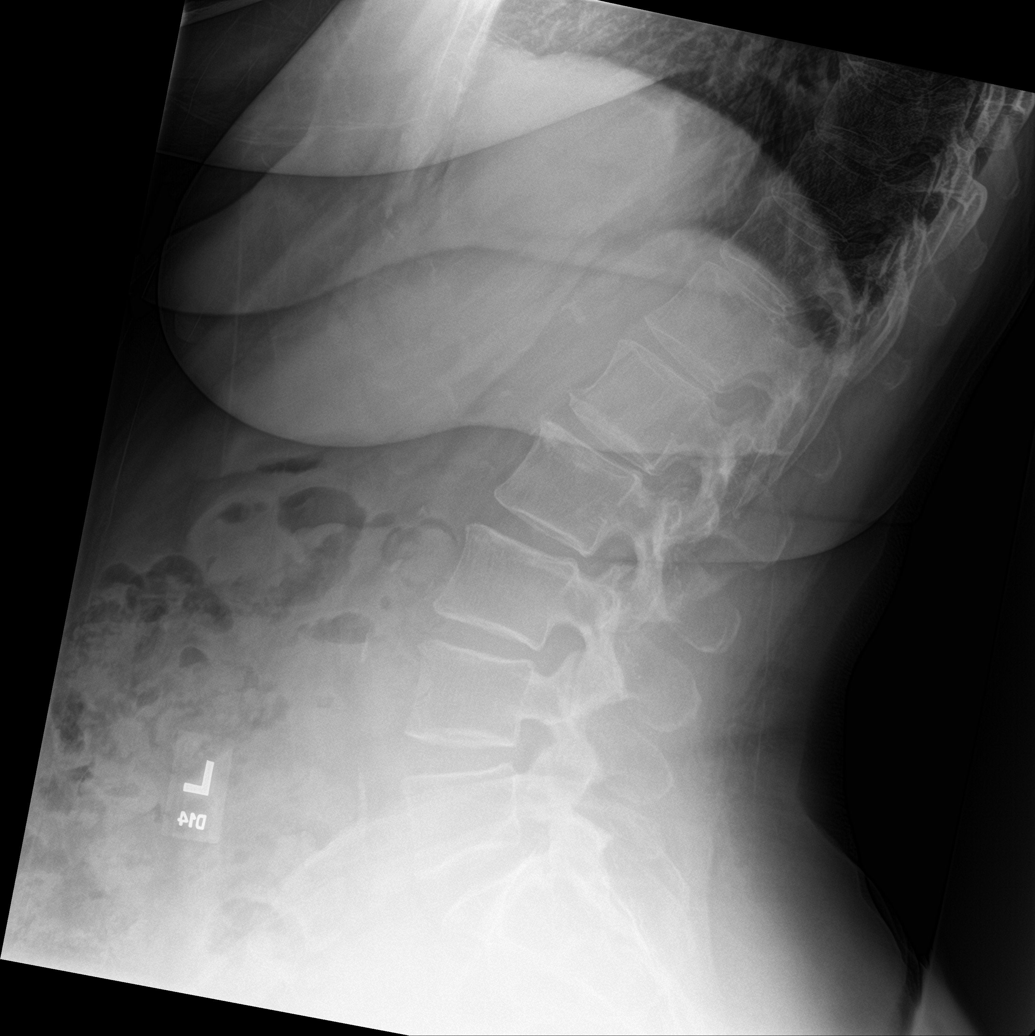

[l-spine spot]
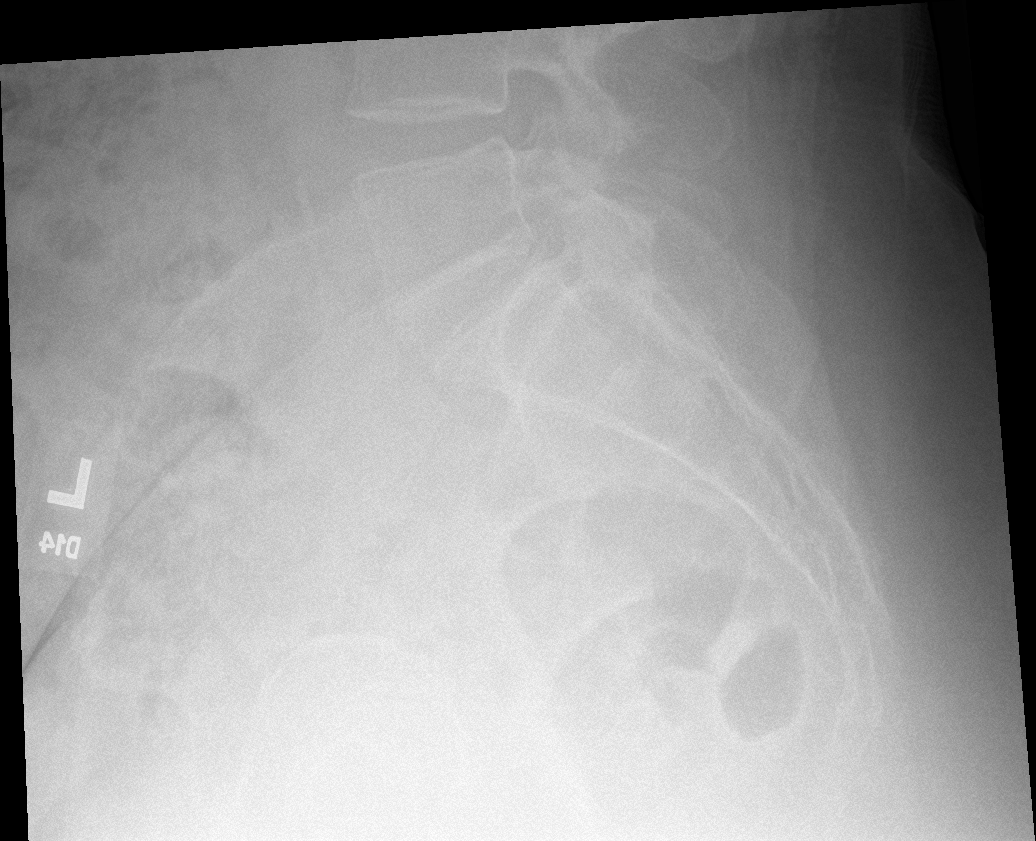

[7 of 7 positions shown; findings below may reference images not displayed]

FINDINGS: Frontal, bilateral oblique, lateral neutral, lateral flexion, and
lateral extension views of the lumbar spine are obtained. There are
5 non-rib-bearing lumbar type vertebral bodies, with mild left
convex curvature centered at L3. Otherwise alignment is anatomic.
There is mild spondylosis at L1-2 and L2-3. Mild diffuse facet
hypertrophy is noted. No acute fractures.

No instability with flexion or extension. Limited excursion of the
lumbar spine.

Sacroiliac joints are unremarkable. Visualized portions of the bony
pelvis are normal.
IMPRESSION: 1. Mild upper lumbar spondylosis and diffuse facet hypertrophy.
2. No acute bony abnormality.
3. No instability with flexion or extension.

## 2022-03-23 IMAGING — RF DG THORACIC SPINE 1V
1 series · 1 of 1 positions shown · non-contrast
Comparison: Thoracic spine radiograph 08/17/2021

CLINICAL DATA: Spinal cord stimulator

EXAM:
OPERATIVE THORACIC SPINE 1 VIEW(S)

[Series 1: dg x-ray · 0.20mm/px · 1 of 1 slices shown]
[im 1/1]
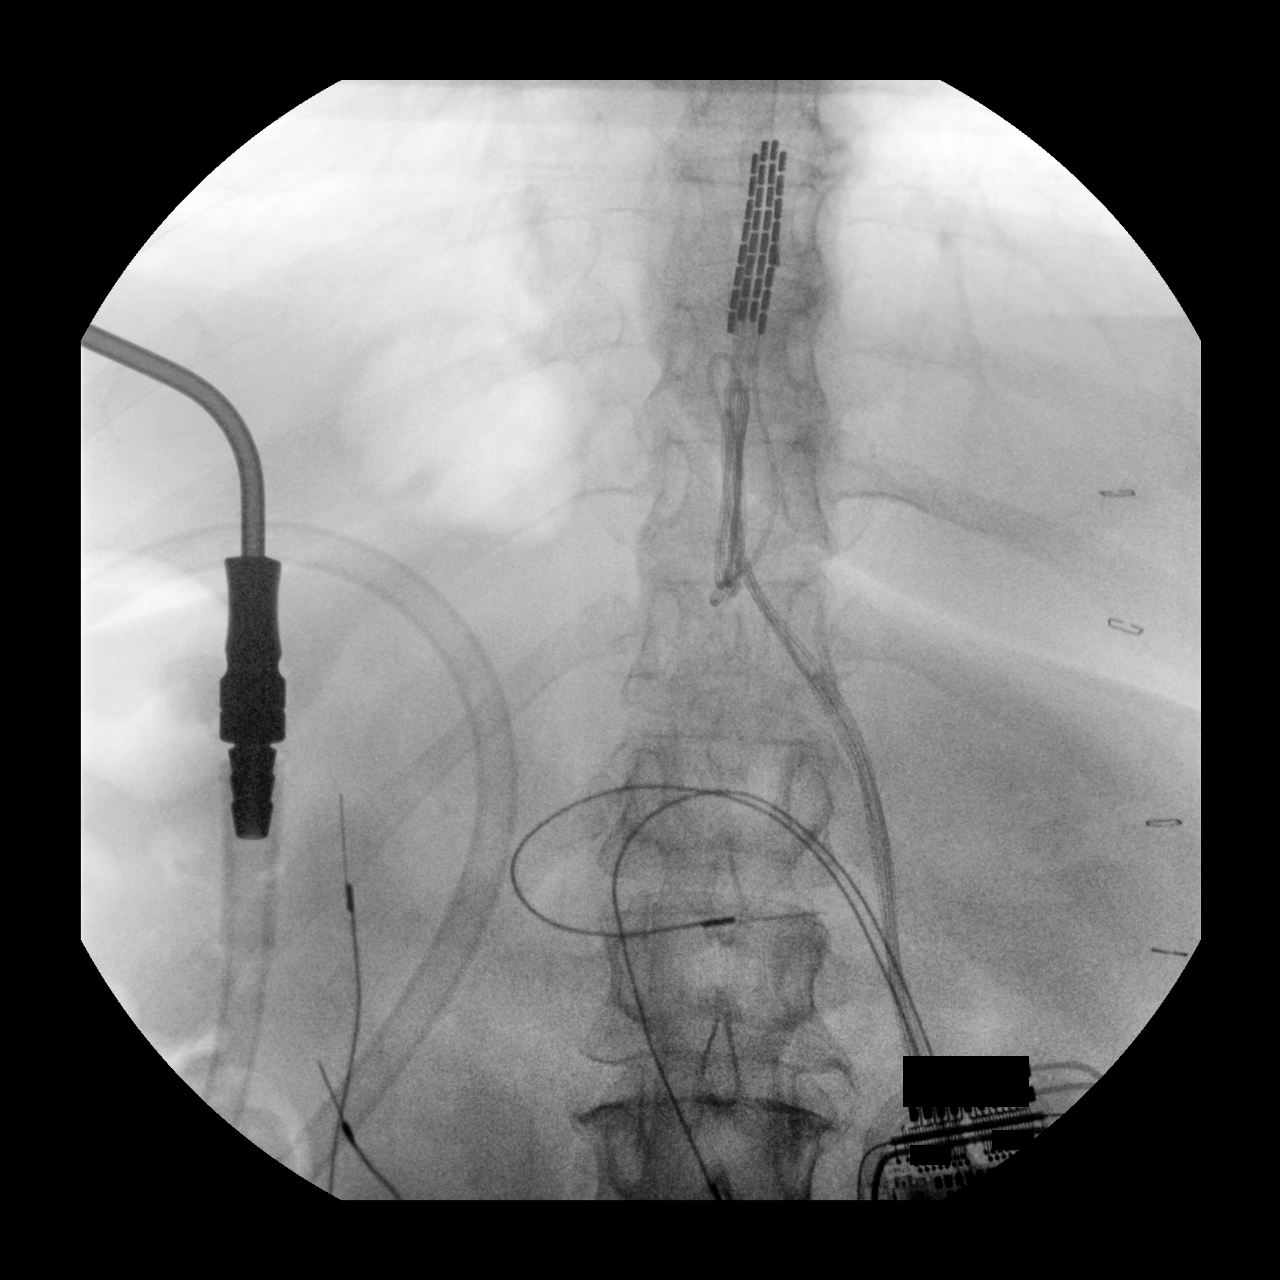

[1 of 1 positions shown; findings below may reference images not displayed]

FINDINGS: Single frontal view provided. There is a spinal cord stimulator with
lead tip overlying the level of T7-T8.
IMPRESSION: Spinal cord stimulator lead tip overlies the level of T7-T8

## 2022-05-24 ENCOUNTER — Other Ambulatory Visit: Payer: Self-pay | Admitting: Family Medicine

## 2022-05-24 DIAGNOSIS — Z1231 Encounter for screening mammogram for malignant neoplasm of breast: Secondary | ICD-10-CM

## 2022-07-01 ENCOUNTER — Ambulatory Visit
Admission: RE | Admit: 2022-07-01 | Discharge: 2022-07-01 | Disposition: A | Discharge status: Home / Self Care | Payer: Medicare HMO | Source: Ambulatory Visit | Attending: Family Medicine | Admitting: Family Medicine

## 2022-07-01 DIAGNOSIS — Z1231 Encounter for screening mammogram for malignant neoplasm of breast: Secondary | ICD-10-CM | POA: Diagnosis not present

## 2023-02-07 ENCOUNTER — Other Ambulatory Visit: Payer: Self-pay

## 2023-02-07 ENCOUNTER — Emergency Department: Payer: Medicare HMO

## 2023-02-07 ENCOUNTER — Other Ambulatory Visit
Admission: RE | Admit: 2023-02-07 | Discharge: 2023-02-07 | Disposition: A | Discharge status: Home / Self Care | Payer: Medicare HMO | Source: Ambulatory Visit | Attending: Internal Medicine | Admitting: Internal Medicine

## 2023-02-07 ENCOUNTER — Emergency Department
Admission: EM | Admit: 2023-02-07 | Discharge: 2023-02-07 | Disposition: A | Discharge status: Home / Self Care | Payer: Medicare HMO | Attending: Emergency Medicine | Admitting: Emergency Medicine

## 2023-02-07 DIAGNOSIS — J42 Unspecified chronic bronchitis: Secondary | ICD-10-CM | POA: Insufficient documentation

## 2023-02-07 DIAGNOSIS — R0602 Shortness of breath: Secondary | ICD-10-CM | POA: Insufficient documentation

## 2023-02-07 DIAGNOSIS — I10 Essential (primary) hypertension: Secondary | ICD-10-CM | POA: Diagnosis not present

## 2023-02-07 DIAGNOSIS — R0789 Other chest pain: Secondary | ICD-10-CM | POA: Insufficient documentation

## 2023-02-07 LAB — BASIC METABOLIC PANEL
Anion gap: 9 (ref 5–15)
BUN: 12 mg/dL (ref 8–23)
CO2: 23 mmol/L (ref 22–32)
Calcium: 10.2 mg/dL (ref 8.9–10.3)
Chloride: 106 mmol/L (ref 98–111)
Creatinine, Ser: 0.91 mg/dL (ref 0.44–1.00)
GFR, Estimated: >60 mL/min (ref >60)
Glucose, Bld: 92 mg/dL (ref 70–99)
Potassium: 4 mmol/L (ref 3.5–5.1)
Sodium: 138 mmol/L (ref 135–145)

## 2023-02-07 LAB — CBC
HCT: 45.3 % (ref 36.0–46.0)
Hemoglobin: 14.6 g/dL (ref 12.0–15.0)
MCH: 28.5 pg (ref 26.0–34.0)
MCHC: 32.2 g/dL (ref 30.0–36.0)
MCV: 88.3 fL (ref 80.0–100.0)
Platelets: 312 10*3/uL (ref 150–400)
RBC: 5.13 MIL/uL — ABNORMAL HIGH (ref 3.87–5.11)
RDW: 15.5 % (ref 11.5–15.5)
WBC: 11.3 10*3/uL — ABNORMAL HIGH (ref 4.0–10.5)
nRBC: 0 % (ref 0.0–0.2)

## 2023-02-07 LAB — D-DIMER, QUANTITATIVE: D-Dimer, Quant: 1.51 ug/mL-FEU — ABNORMAL HIGH (ref 0.00–0.50)

## 2023-02-07 LAB — TROPONIN I (HIGH SENSITIVITY)
Troponin I (High Sensitivity): 3 ng/L (ref <18)
Troponin I (High Sensitivity): 5 ng/L (ref <18)

## 2023-02-07 MED ORDER — DIPHENHYDRAMINE HCL 25 MG PO CAPS: 50.0000 mg | ORAL_CAPSULE | Freq: Once | ORAL | Status: AC

## 2023-02-07 MED ORDER — ACETAMINOPHEN 325 MG PO TABS
650.0000 mg | ORAL_TABLET | Freq: Once | ORAL | Status: AC
  Administered 2023-02-07: 650 mg via ORAL
  Filled 2023-02-07: qty 2

## 2023-02-07 MED ORDER — METHYLPREDNISOLONE SODIUM SUCC 40 MG IJ SOLR
40.0000 mg | Freq: Once | INTRAMUSCULAR | Status: AC
  Administered 2023-02-07: 40 mg via INTRAVENOUS
  Filled 2023-02-07: qty 1

## 2023-02-07 MED ORDER — DIPHENHYDRAMINE HCL 50 MG/ML IJ SOLN
50.0000 mg | Freq: Once | INTRAMUSCULAR | Status: AC
  Administered 2023-02-07: 50 mg via INTRAVENOUS
  Filled 2023-02-07: qty 1

## 2023-02-07 MED ORDER — IOHEXOL 350 MG/ML SOLN
75.0000 mL | Freq: Once | INTRAVENOUS | Status: AC | PRN
  Administered 2023-02-07: 75 mL via INTRAVENOUS

## 2023-02-07 NOTE — ED Notes (Signed)
See triage notes. Was sent over to the ER from [facility].

## 2023-02-07 NOTE — Progress Notes (Signed)
IVT consult placed at 1446 for PIV start with blood draw- Pt status noted to be in waiting room.  No order for IV medications or scans.  This RN to bedside at 1554 once patient noted to be roomed at 1550.  Pt with questions about POC, inquiring why she needs an IV. All labs collected, no scans ordered at that time and pt with IV contrast allergy.  Jettie Pagan, Paramedic to bedside, will place another consult should PIV access needed.

## 2023-02-07 NOTE — Discharge Instructions (Signed)
Fortunately your testing in the emergency department did not show any signs of dangerous conditions like heart attack or blood clot in your lungs.  Please follow-up with your primary doctor for ongoing evaluation and treatment of your bronchitis and sinusitis and shortness of breath.  Thank you for choosing [place] for your health care today!  Please see your primary doctor this week for a follow up appointment.   If you have any new, worsening, or unexpected symptoms call your doctor right away or come back to the emergency department for reevaluation.  It was my pleasure to care for you today.   Daneil Dan Modesto Charon, MD

## 2023-02-07 NOTE — ED Provider Notes (Signed)
[facility] Provider Note    Event Date/Time   First MD Initiated Contact with Patient 02/07/23 1557     (approximate)   History   Chest Pain   HPI  Elizabeth Mann is a 67 y.o. female   Past medical history of depression anxiety, hypertension hyperlipidemia, GERD who presents emergency department with chronic shortness of breath, cough, nasal congestion.  Was seen at clinic earlier today and diagnosed with sinusitis but also had a D-dimer drawn which was high so sent to the emergency department for rule out VTE.  She had some left lower extremity cramping in the calf as well.  She is concerned because she took her blood pressure earlier today which was high.  She has been stressed with the death of family members recently.  She has had chronic unchanged respiratory symptoms including productive cough for which she is seen by her outpatient doctor and has been taking Mucinex, and a Ellipta inhaler.   External Medical Documents Reviewed: Visit from internal medicine earlier today for hypertension, cough      Physical Exam   Triage Vital Signs: ED Triage Vitals  Enc Vitals Group     BP 02/07/23 1432 (!) 178/99     Pulse Rate 02/07/23 1432 (!) 59     Resp 02/07/23 1432 20     Temp 02/07/23 1432 98.2 F (36.8 C)     Temp src --      SpO2 02/07/23 1432 99 %     Weight 02/07/23 1434 166 lb (75.3 kg)     Height 02/07/23 1434 5\' 2"  (1.575 m)     Head Circumference --      Peak Flow --      Pain Score 02/07/23 1434 2     Pain Loc --      Pain Edu? --      Excl. in GC? --     Most recent vital signs: Vitals:   02/07/23 1432  BP: (!) 178/99  Pulse: (!) 59  Resp: 20  Temp: 98.2 F (36.8 C)  SpO2: 99%    General: Awake, no distress.  CV:  Good peripheral perfusion.  Resp:  Normal effort.  Abd:  No distention.  Other:  Awake comfortable hypertensive otherwise vital signs normal.  Afebrile no hypoxemia.  Clear lungs to auscultation bilateral  lung fields, soft nontender abdomen skin appears warm well-perfused, no unilateral leg swelling neurovascular intact.   ED Results / Procedures / Treatments   Labs (all labs ordered are listed, but only abnormal results are displayed) Labs Reviewed  CBC - Abnormal; Notable for the following components:      Result Value   WBC 11.3 (*)    RBC 5.13 (*)    All other components within normal limits  BASIC METABOLIC PANEL  TROPONIN I (HIGH SENSITIVITY)  TROPONIN I (HIGH SENSITIVITY)     I ordered and reviewed the above labs they are notable for white blood cell count is elevated 11, initial troponin negative.  EKG  ED ECG REPORT I, Pilar Jarvis, the attending physician, personally viewed and interpreted this ECG.   Date: 02/07/2023  EKG Time: 1425  Rate: 65  Rhythm: nsr  Axis: n  Intervals:none  ST&T Change: T wave inversions inferior leads- no STEMI    RADIOLOGY I independently reviewed and interpreted ultrasound of the leg and see no obvious DVT   PROCEDURES:  Critical Care performed: No  Procedures   MEDICATIONS ORDERED IN ED:  Medications  diphenhydrAMINE (BENADRYL) capsule 50 mg (has no administration in time range)    Or  diphenhydrAMINE (BENADRYL) injection 50 mg (has no administration in time range)  methylPREDNISolone sodium succinate (SOLU-MEDROL) 40 mg/mL injection 40 mg (40 mg Intravenous Given 02/07/23 1702)     IMPRESSION / MDM / ASSESSMENT AND PLAN / ED COURSE  I reviewed the triage vital signs and the nursing notes.                                Patient's presentation is most consistent with acute presentation with potential threat to life or bodily function.  Differential diagnosis includes, but is not limited to, PE, DVT, bronchitis chronic, pna, ACS   The patient is on the cardiac monitor to evaluate for evidence of arrhythmia and/or significant heart rate changes.  MDM: Patient sent from clinic with a positive D-dimer and chronic  shortness of breath and chronic bronchitis, leg cramping pain.  Negative DVT on ultrasound, given clinical concern for PE from clinic with positive D-dimer we will get a CT angiogram of the chest to rule out PE.  Low clinical suspicion given chronicity of symptoms more associated with chronic bronchitis.  Denies chest pain doubt ACS with nonischemic EKG, T wave inversions nonspecific, follow-up with serial troponins rule out cardiac ischemia.  If workup as above is unremarkable she will discharge today with PMD follow-up for hypertension and ongoing chronic bronchitis treatment.         FINAL CLINICAL IMPRESSION(S) / ED DIAGNOSES   Final diagnoses:  Chronic bronchitis, unspecified chronic bronchitis type (HCC)  Chronic shortness of breath     Rx / DC Orders   ED Discharge Orders     None        Note:  This document was prepared using Dragon voice recognition software and may include unintentional dictation errors.    Pilar Jarvis, MD 02/07/23 [phone]

## 2023-02-07 NOTE — ED Notes (Signed)
Unsuccessful iv attempt.

## 2023-02-07 NOTE — ED Notes (Signed)
Patient inquiring about when she will be seen by provider.  Patient educated about the wait time and levels of acuity and educated that the provider would be in as soon as they could.  Patient verbalized understanding.

## 2023-02-07 NOTE — ED Provider Notes (Signed)
-----------------------------------------   10:14 PM on 02/07/2023 -----------------------------------------  Blood pressure (!) 178/99, pulse (!) 59, temperature 98.2 F (36.8 C), resp. rate 20, height 5\' 2"  (1.575 m), weight 75.3 kg, SpO2 99 %.  Assuming care from Dr. Modesto Charon.  In short, Elizabeth Mann is a 67 y.o. female with a chief complaint of Chest Pain .  Refer to the original H&P for additional details.  The current plan of care is to await CT scan and second troponin.  Patient presented to the emergency department with cough, being treated for bronchitis.  Patient had labs drawn at her primary care and had an elevated D-dimer.  She is allergic to IV contrast and was going through premedication protocol.  Patient second troponin is reassuring, CT scan reveals no evidence of PE.  Suspect that she does have a component of walking pneumonia given the length of her bronchitis symptoms.  She is already been treated with a shot of Rocephin and is placed on a Z-Pak from her primary care.  At this time patient is stable for discharge.  Follow-up primary care as needed.  ED diagnosis:  Chronic bronchitis    Nymir Ringler, Renold Don 02/07/23 2215    Pilar Jarvis, MD 02/08/23 [phone]

## 2023-02-07 NOTE — ED Provider Triage Note (Signed)
Emergency Medicine Provider Triage Evaluation Note  GEANNE NISHIOKA , a 67 y.o. female  was evaluated in triage.  Pt complains of chest pain. Sent from [facility] after d-dimer test is elevated. She also has had some "cramping" in the left calf.  Physical Exam  BP (!) 178/99   Pulse (!) 59   Temp 98.2 F (36.8 C)   Resp 20   Ht 5\' 2"  (1.575 m)   Wt 75.3 kg   SpO2 99%   BMI 30.36 kg/m  Gen:   Awake, no distress   Resp:  Normal effort  MSK:   Moves extremities without difficulty  Other:  Anxious and tearful  Medical Decision Making  Medically screening exam initiated at 2:39 PM.  Appropriate orders placed.  REENA ABRUZZO was informed that the remainder of the evaluation will be completed by another provider, this initial triage assessment does not replace that evaluation, and the importance of remaining in the ED until their evaluation is complete.  Additional labs and imaging ordered.   Chinita Pester, FNP 02/07/23 1442

## 2023-02-07 NOTE — ED Notes (Signed)
Lab contacted to collect labs. 

## 2023-02-07 NOTE — ED Triage Notes (Signed)
Pt to ED for chest pain started today. States has had chronic shob since covid in September, takes inhaler. Dx with bronchitis. Sent from [facility] for elevated d dimer. Denies n/v.  Pt speaking in complete sentences, RR even and unlabored.

## 2023-02-17 ENCOUNTER — Other Ambulatory Visit: Payer: Self-pay | Admitting: Internal Medicine

## 2023-02-17 DIAGNOSIS — R079 Chest pain, unspecified: Secondary | ICD-10-CM

## 2023-02-17 DIAGNOSIS — R0602 Shortness of breath: Secondary | ICD-10-CM

## 2023-02-21 ENCOUNTER — Ambulatory Visit
Admission: RE | Admit: 2023-02-21 | Discharge: 2023-02-21 | Disposition: A | Discharge status: Home / Self Care | Payer: Self-pay | Source: Ambulatory Visit | Attending: Internal Medicine | Admitting: Internal Medicine

## 2023-02-21 DIAGNOSIS — R0602 Shortness of breath: Secondary | ICD-10-CM | POA: Insufficient documentation

## 2023-02-21 DIAGNOSIS — R079 Chest pain, unspecified: Secondary | ICD-10-CM | POA: Insufficient documentation

## 2023-05-29 ENCOUNTER — Other Ambulatory Visit: Payer: Self-pay | Admitting: Family Medicine

## 2023-05-29 DIAGNOSIS — Z1231 Encounter for screening mammogram for malignant neoplasm of breast: Secondary | ICD-10-CM

## 2023-06-05 ENCOUNTER — Ambulatory Visit: Payer: Medicare HMO | Admitting: Surgery

## 2023-06-05 ENCOUNTER — Encounter: Payer: Self-pay | Admitting: Surgery

## 2023-06-05 VITALS — BP 135/71 | HR 90 | Temp 97.8°F | Ht 62.0 in | Wt 178.6 lb

## 2023-06-05 DIAGNOSIS — R1310 Dysphagia, unspecified: Secondary | ICD-10-CM

## 2023-06-05 DIAGNOSIS — K2100 Gastro-esophageal reflux disease with esophagitis, without bleeding: Secondary | ICD-10-CM

## 2023-06-05 DIAGNOSIS — K449 Diaphragmatic hernia without obstruction or gangrene: Secondary | ICD-10-CM | POA: Diagnosis not present

## 2023-06-05 NOTE — Patient Instructions (Addendum)
Your CT is scheduled for 06/13/2023 8:30 am (arrive by 8 am) at [facility]. Barium Swallow to follow CT. Nothing to eat or drink 3 hours prior.   Please get labs drawn at [facility]. You do not need an appointment.    A referral has been placed to [facility] (Dr. Servando Snare), They will call you for an appointment.  If you have any concerns or questions, please feel free to call our office. See follow up appointment.    Hiatal Hernia  A hiatal hernia occurs when part of the stomach slides above the muscle that separates the abdomen from the chest (diaphragm). A person can be born with a hiatal hernia (congenital), or it may develop over time. In almost all cases of hiatal hernia, only the top part of the stomach pushes through the diaphragm. Many people have a hiatal hernia with no symptoms. The larger the hernia, the more likely it is that you will have symptoms. In some cases, a hiatal hernia allows stomach acid to flow back into the tube that carries food from your mouth to your stomach (esophagus). This may cause heartburn symptoms. The development of heartburn symptoms may mean that you have a condition called gastroesophageal reflux disease (GERD). What are the causes? This condition is caused by a weakness in the opening (hiatus) where the esophagus passes through the diaphragm to attach to the upper part of the stomach. A person may be born with a weakness in the hiatus, or a weakness can develop over time. What increases the risk? This condition is more likely to develop in: Older people. Age is a major risk factor for a hiatal hernia, especially if you are over the age of 43. Pregnant women. People who are overweight. People who have frequent constipation. What are the signs or symptoms? Symptoms of this condition usually develop in the form of GERD symptoms. Symptoms include: Heartburn. Upset stomach (indigestion). Trouble swallowing. Coughing or wheezing. Wheezing is  making high-pitched whistling sounds when you breathe. Sore throat. Chest pain. Nausea and vomiting. How is this diagnosed? This condition may be diagnosed during testing for GERD. Tests that may be done include: X-rays of your stomach or chest. An upper gastrointestinal (GI) series. This is an X-ray exam of your GI tract that is taken after you swallow a chalky liquid that shows up clearly on the X-ray. Endoscopy. This is a procedure to look into your stomach using a thin, flexible tube that has a tiny camera and light on the end of it. How is this treated? This condition may be treated by: Dietary and lifestyle changes to help reduce GERD symptoms. Medicines. These may include: Over-the-counter antacids. Medicines that make your stomach empty more quickly. Medicines that block the production of stomach acid (H2 blockers). Stronger medicines to reduce stomach acid (proton pump inhibitors). Surgery to repair the hernia, if other treatments are not helping. If you have no symptoms, you may not need treatment. Follow these instructions at home: Lifestyle and activity Do not use any products that contain nicotine or tobacco. These products include cigarettes, chewing tobacco, and vaping devices, such as e-cigarettes. If you need help quitting, ask your health care provider. Try to achieve and maintain a healthy body weight. Avoid putting pressure on your abdomen. Anything that puts pressure on your abdomen increases the amount of acid that may be pushed up into your esophagus. Avoid bending over, especially after eating. Raise the head of your bed by putting blocks under  the legs. This keeps your head and esophagus higher than your stomach. Do not wear tight clothing around your chest or stomach. Try not to strain when having a bowel movement, when urinating, or when lifting heavy objects. Eating and drinking Avoid foods that can worsen GERD symptoms. These may include: Fatty foods, like  fried foods. Citrus fruits, like oranges or lemon. Other foods and drinks that contain acid, like orange juice or tomatoes. Spicy food. Chocolate. Eat frequent small meals instead of three large meals a day. This helps prevent your stomach from getting too full. Eat slowly. Do not lie down right after eating. Do not eat 1-2 hours before bed. Do not drink beverages with caffeine. These include cola, coffee, cocoa, and tea. Do not drink alcohol. General instructions Take over-the-counter and prescription medicines only as told by your health care provider. Keep all follow-up visits. Your health care provider will want to check that any new prescribed medicines are helping your symptoms. Contact a health care provider if: Your symptoms are not controlled with medicines or lifestyle changes. You are having trouble swallowing. You have coughing or wheezing that will not go away. Your pain is getting worse. Your pain spreads to your arms, neck, jaw, teeth, or back. You feel nauseous or you vomit. Get help right away if: You have shortness of breath. You vomit blood. You have bright red blood in your stools. You have black, tarry stools. These symptoms may be an emergency. Get help right away. Call 911. Do not wait to see if the symptoms will go away. Do not drive yourself to the hospital. Summary A hiatal hernia occurs when part of the stomach slides above the muscle that separates the abdomen from the chest. A person may be born with a weakness in the hiatus, or a weakness can develop over time. Symptoms of a hiatal hernia may include heartburn, trouble swallowing, or sore throat. Management of a hiatal hernia includes eating frequent small meals instead of three large meals a day. Get help right away if you vomit blood, have bright red blood in your stools, or have black, tarry stools. This information is not intended to replace advice given to you by your health care provider. Make  sure you discuss any questions you have with your health care provider. Document Revised: 09/14/2021 Document Reviewed: 09/14/2021 Elsevier Patient Education  2024 [employer].

## 2023-06-07 NOTE — Progress Notes (Signed)
Patient ID: Elizabeth Mann, female   DOB: Nov 14, 1955, 67 y.o.   MRN: [id number]  HPI Elizabeth Mann is a 67 y.o. female seen in consultation at the request of Dr.Aleskerov for symptomatic paraesophageal hernia.  She does have a history of chronic bronchitis and chronic cough.  Recently placed on a PPI with improvement of some pulmonary symptoms.  She does have history of reflux. She did have a recent CT of the chest that I have personally reviewed showing evidence of a moderate paraesophageal hernia.  No evidence of complicating features. HPI  Past Medical History:  Diagnosis Date   Anxiety    Depression    GERD (gastroesophageal reflux disease)    Hyperlipidemia    Hypertension    Insomnia    Sleep apnea     Past Surgical History:  Procedure Laterality Date   ABDOMINAL HYSTERECTOMY     BREAST BIOPSY Right    neg   OOPHORECTOMY     THORACIC LAMINECTOMY FOR SPINAL CORD STIMULATOR N/A 10/11/2021   Procedure: THORACIC LAMINECTOMY FOR SPINAL CORD STIMULATOR ([employer]);  Surgeon: Venetia Night, MD;  Location: [facility] ORS;  Service: Neurosurgery;  Laterality: N/A;    Family History  Problem Relation Age of Onset   Breast cancer Neg Hx     Social History Social History   Tobacco Use   Smoking status: Never   Smokeless tobacco: Never  Vaping Use   Vaping status: Never Used  Substance Use Topics   Alcohol use: No   Drug use: Never    Allergies  Allergen Reactions   Prednisone Other (See Comments)    headache   Contrast Media [Iodinated Contrast Media] Hives    Per patient, the healthcare providers were not sure if the oral contrast or the IV contrast causes hives/ rashes/itching.     Current Outpatient Medications  Medication Sig Dispense Refill   atorvastatin (LIPITOR) 10 MG tablet Take 10 mg by mouth daily.  3   Calcium Carbonate-Vitamin D 600-400 MG-UNIT tablet Take 1 tablet by mouth daily.     Cholecalciferol (VITAMIN D3) 125 MCG (5000 UT) CAPS Take  5,000 Units by mouth daily.     escitalopram (LEXAPRO) 10 MG tablet Take 10 mg by mouth daily.     estradiol (ESTRACE) 0.5 MG tablet Take 0.5 mg by mouth daily.     gabapentin (NEURONTIN) 300 MG capsule Take 300 mg by mouth at bedtime.     pantoprazole (PROTONIX) 40 MG tablet Take 40 mg by mouth daily.  3   traZODone (DESYREL) 100 MG tablet Take 100 mg by mouth at bedtime.     vitamin B-12 (CYANOCOBALAMIN) 1000 MCG tablet Take 1,000 mcg by mouth daily.     fluticasone (FLONASE) 50 MCG/ACT nasal spray Place 1 spray into both nostrils daily.     No current facility-administered medications for this visit.     Review of Systems Full ROS  was asked and was negative except for the information on the HPI  Physical Exam Blood pressure 135/71, pulse 90, temperature 97.8 F (36.6 C), temperature source Oral, height 5\' 2"  (1.575 m), weight 178 lb 9.6 oz (81 kg), SpO2 98%. CONSTITUTIONAL: NAD. EYES: Pupils are equal, round, and reactive to light, Sclera are non-icteric. EARS, NOSE, MOUTH AND THROAT: The oropharynx is clear. The oral mucosa is pink and moist. Hearing is intact to voice. LYMPH NODES:  Lymph nodes in the neck are normal. RESPIRATORY:  Lungs are clear. There is normal respiratory effort, with  equal breath sounds bilaterally, and without pathologic use of accessory muscles. CARDIOVASCULAR: Heart is regular without murmurs, gallops, or rubs. GI: The abdomen is  soft, nontender, and nondistended. There are no palpable masses. There is no hepatosplenomegaly. There are normal bowel sounds in all quadrants. GU: Rectal deferred.   MUSCULOSKELETAL: Normal muscle strength and tone. No cyanosis or edema.   SKIN: Turgor is good and there are no pathologic skin lesions or ulcers. NEUROLOGIC: Motor and sensation is grossly normal. Cranial nerves are grossly intact. PSYCH:  Oriented to person, place and time. Affect is normal.  Data Reviewed  I have personally reviewed the patient's imaging,  laboratory findings and medical records.    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  Discussed with the patient in detail about her disease process.  Options of continuation with medical management versus potential surgical repair.  The patient seems to be very interested in a surgical approach.  I do think that we will need appropriate workup and we will place order for the CT of the abdomen pelvis as well as a barium swallow and upper endoscopy.  I did discuss with her in detail about the procedure.  The risk, benefits and possible implications as well as recovery and diet medication.  She seems to be understanding.  Please note I spent 60 minutes in this encounter including personally reviewing imaging studies, extensive review of medical records, placing orders and performing documentation  Sterling Big, MD FACS General Surgeon 06/07/2023, 6:46 PM

## 2023-06-13 ENCOUNTER — Other Ambulatory Visit: Payer: Medicare HMO

## 2023-06-13 ENCOUNTER — Ambulatory Visit: Admission: RE | Admit: 2023-06-13 | Payer: Medicare HMO | Source: Ambulatory Visit

## 2023-06-19 ENCOUNTER — Ambulatory Visit: Payer: Medicare HMO

## 2023-06-19 ENCOUNTER — Other Ambulatory Visit: Payer: Medicare HMO

## 2023-06-26 ENCOUNTER — Telehealth: Payer: Self-pay

## 2023-06-26 NOTE — Telephone Encounter (Signed)
Patient called in to schedule and EGD.

## 2023-06-27 ENCOUNTER — Other Ambulatory Visit: Payer: Self-pay

## 2023-06-27 ENCOUNTER — Ambulatory Visit
Admission: RE | Admit: 2023-06-27 | Discharge: 2023-06-27 | Disposition: A | Discharge status: Home / Self Care | Payer: Medicare HMO | Source: Ambulatory Visit | Attending: Surgery | Admitting: Surgery

## 2023-06-27 ENCOUNTER — Other Ambulatory Visit
Admission: RE | Admit: 2023-06-27 | Discharge: 2023-06-27 | Disposition: A | Discharge status: Home / Self Care | Payer: Medicare HMO | Source: Ambulatory Visit | Attending: Surgery | Admitting: Surgery

## 2023-06-27 DIAGNOSIS — K2100 Gastro-esophageal reflux disease with esophagitis, without bleeding: Secondary | ICD-10-CM

## 2023-06-27 DIAGNOSIS — R1310 Dysphagia, unspecified: Secondary | ICD-10-CM

## 2023-06-27 LAB — BUN: BUN: 14 mg/dL (ref 8–23)

## 2023-06-27 LAB — CREATININE, SERUM
Creatinine, Ser: 0.94 mg/dL (ref 0.44–1.00)
GFR, Estimated: >60 mL/min (ref >60)

## 2023-06-27 NOTE — Telephone Encounter (Signed)
EGD scheduled for 07/14/23 with Dr Servando Snare

## 2023-06-28 ENCOUNTER — Ambulatory Visit
Admission: RE | Admit: 2023-06-28 | Discharge: 2023-06-28 | Disposition: A | Discharge status: Home / Self Care | Payer: Medicare HMO | Source: Ambulatory Visit | Attending: Surgery | Admitting: Surgery

## 2023-06-28 ENCOUNTER — Ambulatory Visit: Payer: Medicare HMO | Admitting: Surgery

## 2023-06-28 ENCOUNTER — Ambulatory Visit: Payer: Medicare HMO

## 2023-06-28 ENCOUNTER — Telehealth: Payer: Self-pay

## 2023-06-28 DIAGNOSIS — K2100 Gastro-esophageal reflux disease with esophagitis, without bleeding: Secondary | ICD-10-CM | POA: Insufficient documentation

## 2023-06-28 DIAGNOSIS — R1310 Dysphagia, unspecified: Secondary | ICD-10-CM | POA: Diagnosis present

## 2023-06-28 NOTE — Telephone Encounter (Signed)
-----   Message from [place] sent at 06/28/2023  1:19 PM EST ----- Plese let her know swallow shows moderate hiatal hernia and reflux but no other surprises ----- Message ----- From: Interface, Rad Results In Sent: 06/28/2023   1:01 PM EST To: Leafy Ro, MD

## 2023-06-28 NOTE — Telephone Encounter (Signed)
Notified patient as instructed, patient pleased. Discussed follow-up appointments, patient agrees  

## 2023-07-04 ENCOUNTER — Encounter: Payer: Self-pay | Admitting: Gastroenterology

## 2023-07-05 ENCOUNTER — Ambulatory Visit: Payer: Medicare HMO | Admitting: Surgery

## 2023-07-10 ENCOUNTER — Ambulatory Visit
Admission: RE | Admit: 2023-07-10 | Discharge: 2023-07-10 | Disposition: A | Discharge status: Home / Self Care | Payer: Medicare HMO | Source: Ambulatory Visit | Attending: Family Medicine | Admitting: Family Medicine

## 2023-07-10 DIAGNOSIS — Z1231 Encounter for screening mammogram for malignant neoplasm of breast: Secondary | ICD-10-CM | POA: Diagnosis present

## 2023-07-12 ENCOUNTER — Telehealth: Payer: Self-pay

## 2023-07-12 ENCOUNTER — Encounter: Payer: Self-pay | Admitting: Gastroenterology

## 2023-07-12 NOTE — Telephone Encounter (Signed)
-----   Message from [place] sent at 07/12/2023  3:13 AM EST ----- Please let her know CT showed small Hiatal H but no other surprises ----- Message ----- From: Interface, Rad Results In Sent: 07/11/2023  10:54 PM EST To: Leafy Ro, MD

## 2023-07-12 NOTE — Telephone Encounter (Signed)
Notified patient as instructed, patient pleased. Discussed follow-up appointments, patient agrees  

## 2023-07-12 NOTE — Anesthesia Preprocedure Evaluation (Addendum)
Anesthesia Evaluation  Patient identified by MRN, date of birth, ID band Patient awake    Reviewed: Allergy & Precautions, H&P , NPO status , Patient's Chart, lab work & pertinent test results  Airway Mallampati: III  TM Distance: >3 FB Neck ROM: Full  Mouth opening: Limited Mouth Opening  Dental no notable dental hx. (+) Upper Dentures   Pulmonary neg pulmonary ROS, sleep apnea    Pulmonary exam normal breath sounds clear to auscultation       Cardiovascular hypertension, negative cardio ROS Normal cardiovascular exam Rhythm:Regular Rate:Normal     Neuro/Psych  Headaches PSYCHIATRIC DISORDERS Anxiety Depression     Neuromuscular disease negative neurological ROS  negative psych ROS   GI/Hepatic negative GI ROS, Neg liver ROS, hiatal hernia,GERD  ,,  Endo/Other  negative endocrine ROS    Renal/GU negative Renal ROS  negative genitourinary   Musculoskeletal negative musculoskeletal ROS (+) Arthritis ,    Abdominal   Peds negative pediatric ROS (+)  Hematology negative hematology ROS (+)   Anesthesia Other Findings Anxiety  Depression GERD (gastroesophageal reflux disease) Sleep apnea listed on Epic, but patient denies having sleep apnea.  Hyperlipidemia  Hypertension Insomnia  Hiatal hernia with gastroesophageal reflux GERD with esophagitis     Reproductive/Obstetrics negative OB ROS                              Anesthesia Physical Anesthesia Plan  ASA: 2  Anesthesia Plan: General   Post-op Pain Management:    Induction: Intravenous  PONV Risk Score and Plan:   Airway Management Planned: Natural Airway and Nasal Cannula  Additional Equipment:   Intra-op Plan:   Post-operative Plan:   Informed Consent: I have reviewed the patients History and Physical, chart, labs and discussed the procedure including the risks, benefits and alternatives for the proposed anesthesia  with the patient or authorized representative who has indicated his/her understanding and acceptance.     Dental Advisory Given  Plan Discussed with: Anesthesiologist, CRNA and Surgeon  Anesthesia Plan Comments: (Patient consented for risks of anesthesia including but not limited to:  - adverse reactions to medications - risk of airway placement if required - damage to eyes, teeth, lips or other oral mucosa - nerve damage due to positioning  - sore throat or hoarseness - Damage to heart, brain, nerves, lungs, other parts of body or loss of life  Patient voiced understanding and assent.)       Anesthesia Quick Evaluation

## 2023-07-14 ENCOUNTER — Encounter: Payer: Self-pay | Admitting: Gastroenterology

## 2023-07-14 ENCOUNTER — Ambulatory Visit: Payer: Medicare HMO | Admitting: Anesthesiology

## 2023-07-14 ENCOUNTER — Ambulatory Visit
Admission: RE | Admit: 2023-07-14 | Discharge: 2023-07-14 | Disposition: A | Discharge status: Home / Self Care | Payer: Medicare HMO | Attending: Gastroenterology | Admitting: Gastroenterology

## 2023-07-14 ENCOUNTER — Encounter
Admission: RE | Disposition: A | Discharge status: Home / Self Care | Payer: Self-pay | Source: Home / Self Care | Attending: Gastroenterology

## 2023-07-14 ENCOUNTER — Other Ambulatory Visit: Payer: Self-pay

## 2023-07-14 DIAGNOSIS — I10 Essential (primary) hypertension: Secondary | ICD-10-CM | POA: Diagnosis not present

## 2023-07-14 DIAGNOSIS — K2100 Gastro-esophageal reflux disease with esophagitis, without bleeding: Secondary | ICD-10-CM | POA: Diagnosis present

## 2023-07-14 DIAGNOSIS — K449 Diaphragmatic hernia without obstruction or gangrene: Secondary | ICD-10-CM | POA: Diagnosis not present

## 2023-07-14 HISTORY — DX: Other specified postprocedural states: Z98.890

## 2023-07-14 HISTORY — DX: Gastro-esophageal reflux disease without esophagitis: K21.9

## 2023-07-14 HISTORY — PX: ESOPHAGOGASTRODUODENOSCOPY (EGD) WITH PROPOFOL: SHX5813

## 2023-07-14 HISTORY — DX: Gastro-esophageal reflux disease with esophagitis, without bleeding: K21.00

## 2023-07-14 HISTORY — DX: Nausea with vomiting, unspecified: R11.2

## 2023-07-14 SURGERY — ESOPHAGOGASTRODUODENOSCOPY (EGD) WITH PROPOFOL
Anesthesia: General | Site: Mouth

## 2023-07-14 MED ORDER — PROPOFOL 10 MG/ML IV BOLUS
INTRAVENOUS | Status: AC
Start: 2023-07-14 — End: ?
  Filled 2023-07-14: qty 20

## 2023-07-14 MED ORDER — STERILE WATER FOR IRRIGATION IR SOLN
Status: DC | PRN
  Administered 2023-07-14: 50 mL

## 2023-07-14 MED ORDER — LIDOCAINE HCL (CARDIAC) PF 100 MG/5ML IV SOSY
PREFILLED_SYRINGE | INTRAVENOUS | Status: DC | PRN
  Administered 2023-07-14: 50 mg via INTRAVENOUS

## 2023-07-14 MED ORDER — ONDANSETRON HCL 4 MG/2ML IJ SOLN
4.0000 mg | Freq: Once | INTRAMUSCULAR | Status: AC
  Administered 2023-07-14: 4 mg via INTRAVENOUS

## 2023-07-14 MED ORDER — LACTATED RINGERS IV SOLN: INTRAVENOUS | Status: DC

## 2023-07-14 MED ORDER — ONDANSETRON HCL 4 MG/2ML IJ SOLN
INTRAMUSCULAR | Status: AC
  Filled 2023-07-14: qty 2

## 2023-07-14 MED ORDER — PROPOFOL 10 MG/ML IV BOLUS
INTRAVENOUS | Status: DC | PRN
  Administered 2023-07-14: 100 mg via INTRAVENOUS

## 2023-07-14 SURGICAL SUPPLY — 28 items
BALLN DILATOR 12-15 8 (BALLOONS)
BALLN DILATOR 15-18 8 (BALLOONS)
BALLN DILATOR CRE 0-12 8 (BALLOONS)
BALLN DILATOR ESOPH 8 10 CRE (MISCELLANEOUS) IMPLANT
BALLOON DILATOR 12-15 8 (BALLOONS) IMPLANT
BALLOON DILATOR 15-18 8 (BALLOONS) IMPLANT
BALLOON DILATOR CRE 0-12 8 (BALLOONS) IMPLANT
BLOCK BITE 60FR ADLT L/F GRN (MISCELLANEOUS) ×1 IMPLANT
CLIP HMST 235XBRD CATH ROT (MISCELLANEOUS) IMPLANT
ELECT REM PT RETURN 9FT ADLT (ELECTROSURGICAL)
ELECTRODE REM PT RTRN 9FT ADLT (ELECTROSURGICAL) IMPLANT
FCP ESCP3.2XJMB 240X2.8X (MISCELLANEOUS)
FORCEPS BIOP RAD 4 LRG CAP 4 (CUTTING FORCEPS) IMPLANT
FORCEPS ESCP3.2XJMB 240X2.8X (MISCELLANEOUS) IMPLANT
GOWN CVR UNV OPN BCK APRN NK (MISCELLANEOUS) ×2 IMPLANT
INJECTOR VARIJECT VIN23 (MISCELLANEOUS) IMPLANT
KIT DEFENDO VALVE AND CONN (KITS) IMPLANT
KIT PRC NS LF DISP ENDO (KITS) ×1 IMPLANT
MANIFOLD NEPTUNE II (INSTRUMENTS) ×1 IMPLANT
MARKER SPOT ENDO TATTOO 5ML (MISCELLANEOUS) IMPLANT
RETRIEVER NET PLAT FOOD (MISCELLANEOUS) IMPLANT
SNARE SHORT THROW 13M SML OVAL (MISCELLANEOUS) IMPLANT
SNARE SHORT THROW 30M LRG OVAL (MISCELLANEOUS) IMPLANT
SYR INFLATION 60ML (SYRINGE) IMPLANT
TRAP ETRAP POLY (MISCELLANEOUS) IMPLANT
VARIJECT INJECTOR VIN23 (MISCELLANEOUS)
WATER STERILE IRR 250ML POUR (IV SOLUTION) ×1 IMPLANT
WIRE CRE 18-20MM 8CM F G (MISCELLANEOUS) IMPLANT

## 2023-07-14 NOTE — H&P (Signed)
Midge Minium, MD [facility] [address] [place], [place] [address] Phone:[phone] Fax : [phone]  Primary Care Physician:  Jerl Mina, MD Primary Gastroenterologist:  Dr. Servando Snare  Pre-Procedure History & Physical: HPI:  Elizabeth Mann is a 67 y.o. female is here for an endoscopy.   Past Medical History:  Diagnosis Date   Anxiety    Depression    GERD (gastroesophageal reflux disease)    GERD with esophagitis    Hiatal hernia with gastroesophageal reflux    Hyperlipidemia    Hypertension    Insomnia    Sleep apnea     Past Surgical History:  Procedure Laterality Date   ABDOMINAL HYSTERECTOMY     BREAST BIOPSY Right    neg   OOPHORECTOMY     THORACIC LAMINECTOMY FOR SPINAL CORD STIMULATOR N/A 10/11/2021   Procedure: THORACIC LAMINECTOMY FOR SPINAL CORD STIMULATOR ([employer]);  Surgeon: Venetia Night, MD;  Location: [facility] ORS;  Service: Neurosurgery;  Laterality: N/A;    Prior to Admission medications   Medication Sig Start Date End Date Taking? Authorizing Provider  atorvastatin (LIPITOR) 10 MG tablet Take 10 mg by mouth daily. 07/22/17  Yes [provider]  Calcium Carbonate-Vitamin D 600-400 MG-UNIT tablet Take 1 tablet by mouth daily.   Yes [provider]  Cholecalciferol (VITAMIN D3) 125 MCG (5000 UT) CAPS Take 5,000 Units by mouth daily.   Yes [provider]  estradiol (ESTRACE) 0.5 MG tablet Take 0.5 mg by mouth daily. 04/29/16  Yes [provider]  gabapentin (NEURONTIN) 300 MG capsule Take 300 mg by mouth at bedtime. 01/27/19  Yes [provider]  pantoprazole (PROTONIX) 40 MG tablet Take 40 mg by mouth daily. 07/22/17  Yes [provider]  traZODone (DESYREL) 100 MG tablet Take 100 mg by mouth at bedtime. 04/29/16  Yes [provider]  vitamin B-12 (CYANOCOBALAMIN) 1000 MCG tablet Take 1,000 mcg by mouth daily.   Yes [provider]  fluticasone (FLONASE) 50 MCG/ACT  nasal spray Place 1 spray into both nostrils daily. 09/09/21 09/09/22  [provider]    Allergies as of 06/27/2023 - Review Complete 06/05/2023  Allergen Reaction Noted   Prednisone Other (See Comments) 02/07/2023   Contrast media [iodinated contrast media] Hives 09/04/2017    Family History  Problem Relation Age of Onset   Breast cancer Neg Hx     Social History   Socioeconomic History   Marital status: Married    Spouse name: Not on file   Number of children: Not on file   Years of education: Not on file   Highest education level: Not on file  Occupational History   Not on file  Tobacco Use   Smoking status: Never   Smokeless tobacco: Never  Vaping Use   Vaping status: Never Used  Substance and Sexual Activity   Alcohol use: No   Drug use: Never   Sexual activity: Not on file  Other Topics Concern   Not on file  Social History Narrative   Disabled and dont work anymore. Live with husband at home.    Social Drivers of Health   Financial Resource Strain: Patient Declined (04/07/2023)   Received from [facility]   Overall Financial Resource Strain (CARDIA)    Difficulty of Paying Living Expenses: Patient declined  Food Insecurity: Patient Declined (04/07/2023)   Received from [facility]   Hunger Vital Sign    Worried About Running Out of Food  in the Last Year: Patient declined    Ran Out of Food in the Last Year: Patient declined  Transportation Needs: No Transportation Needs (04/07/2023)   Received from [facility] - Transportation    In the past 12 months, has lack of transportation kept you from medical appointments or from getting medications?: No    Lack of Transportation (Non-Medical): No  Physical Activity: Not on file  Stress: Not on file  Social Connections: Not on file  Intimate Partner Violence: Not on file    Review of Systems: See HPI, otherwise negative ROS  Physical Exam: BP  (!) 154/70   Pulse 64   Temp (!) 96.9 F (36.1 C) (Temporal)   Resp 18   Ht 5\' 2"  (1.575 m)   Wt 81.2 kg   SpO2 99%   BMI 32.74 kg/m  General:   Alert,  pleasant and cooperative in NAD Head:  Normocephalic and atraumatic. Neck:  Supple; no masses or thyromegaly. Lungs:  Clear throughout to auscultation.    Heart:  Regular rate and rhythm. Abdomen:  Soft, nontender and nondistended. Normal bowel sounds, without guarding, and without rebound.   Neurologic:  Alert and  oriented x4;  grossly normal neurologically.  Impression/Plan: Elizabeth Mann is here for an endoscopy to be performed for GERD  Risks, benefits, limitations, and alternatives regarding  endoscopy have been reviewed with the patient.  Questions have been answered.  All parties agreeable.   Midge Minium, MD  07/14/2023, 8:37 AM

## 2023-07-14 NOTE — Op Note (Signed)
[facility] Patient Name: Elizabeth Mann Procedure Date: 07/14/2023 9:20 AM MRN: [id number] Account #: [id number] Date of Birth: 22-Mar-1956 Admit Type: Outpatient Age: 67 Room: [facility] OR ROOM 01 Gender: Female Note Status: Finalized Instrument Name: [id number] Procedure:             Upper GI endoscopy Indications:           Heartburn Providers:             Midge Minium MD, MD Referring MD:          Midge Minium MD, MD (Referring MD), Rhona Leavens. Burnett Sheng,                         MD (Referring MD) Medicines:             Propofol per Anesthesia Complications:         No immediate complications. Procedure:             Pre-Anesthesia Assessment:                        - Prior to the procedure, a History and Physical was                         performed, and patient medications and allergies were                         reviewed. The patient's tolerance of previous                         anesthesia was also reviewed. The risks and benefits                         of the procedure and the sedation options and risks                         were discussed with the patient. All questions were                         answered, and informed consent was obtained. Prior                         Anticoagulants: The patient has taken no anticoagulant                         or antiplatelet agents. ASA Grade Assessment: II - A                         patient with mild systemic disease. After reviewing                         the risks and benefits, the patient was deemed in                         satisfactory condition to undergo the procedure.                        After obtaining informed consent, the endoscope was  passed under direct vision. Throughout the procedure,                         the patient's blood pressure, pulse, and oxygen                         saturations were monitored continuously. The was                          introduced through the mouth, and advanced to the                         second part of duodenum. The upper GI endoscopy was                         accomplished without difficulty. The patient tolerated                         the procedure well. Findings:      A small hiatal hernia was present.      LA Grade A (one or more mucosal breaks less than 5 mm, not extending       between tops of 2 mucosal folds) esophagitis with no bleeding was found       at the gastroesophageal junction.      The stomach was normal.      The examined duodenum was normal.      Biopsies were taken with a cold forceps in the gastric antrum for       Helicobacter pylori testing. Impression:            - Small hiatal hernia.                        - LA Grade A reflux esophagitis with no bleeding.                        - Normal stomach.                        - Normal examined duodenum.                        - Biopsies were taken with a cold forceps for                         Helicobacter pylori testing. Recommendation:        - Discharge patient to home.                        - Resume previous diet.                        - Continue present medications.                        - Await pathology results. Procedure Code(s):     --- Professional ---                        [phone], Esophagogastroduodenoscopy, flexible,  transoral; with biopsy, single or multiple Diagnosis Code(s):     --- Professional ---                        R12, Heartburn                        K21.00, Gastro-esophageal reflux disease with                         esophagitis, without bleeding CPT copyright 2022 American Medical Association. All rights reserved. The codes documented in this report are preliminary and upon coder review may  be revised to meet current compliance requirements. Midge Minium MD, MD 07/14/2023 9:36:04 AM This report has been signed electronically. Number of Addenda: 0 Note Initiated On:  07/14/2023 9:20 AM Total Procedure Duration: 0 hours 3 minutes 58 seconds  Estimated Blood Loss:  Estimated blood loss: none.      [facility]

## 2023-07-14 NOTE — Transfer of Care (Signed)
Immediate Anesthesia Transfer of Care Note  Patient: Elizabeth Mann  Procedure(s) Performed: ESOPHAGOGASTRODUODENOSCOPY (EGD) WITH PROPOFOL  Patient Location: PACU  Anesthesia Type: General  Level of Consciousness: awake, alert  and patient cooperative  Airway and Oxygen Therapy: Patient Spontanous Breathing and Patient connected to supplemental oxygen  Post-op Assessment: Post-op Vital signs reviewed, Patient's Cardiovascular Status Stable, Respiratory Function Stable, Patent Airway and No signs of Nausea or vomiting  Post-op Vital Signs: Reviewed and stable  Complications: No notable events documented.

## 2023-07-14 NOTE — Anesthesia Postprocedure Evaluation (Signed)
Anesthesia Post Note  Patient: Elizabeth Mann  Procedure(s) Performed: ESOPHAGOGASTRODUODENOSCOPY (EGD) WITH BIOPSY (Mouth)  Patient location during evaluation: PACU Anesthesia Type: General Level of consciousness: awake and alert Pain management: pain level controlled Vital Signs Assessment: post-procedure vital signs reviewed and stable Respiratory status: spontaneous breathing, nonlabored ventilation, respiratory function stable and patient connected to nasal cannula oxygen Cardiovascular status: blood pressure returned to baseline and stable Postop Assessment: no apparent nausea or vomiting Anesthetic complications: no   No notable events documented.   Last Vitals:  Vitals:   07/14/23 0953 07/14/23 0955  BP:  (!) 140/77  Pulse: 68 (!) 59  Resp: 18 16  Temp:    SpO2: 95% 95%    Last Pain:  Vitals:   07/14/23 0946  TempSrc:   PainSc: 0-No pain                 Aman Bonet C Harriet Sutphen

## 2023-07-15 ENCOUNTER — Encounter: Payer: Self-pay | Admitting: Gastroenterology

## 2023-07-17 ENCOUNTER — Encounter: Payer: Self-pay | Admitting: Gastroenterology

## 2023-07-17 LAB — SURGICAL PATHOLOGY

## 2023-07-31 ENCOUNTER — Ambulatory Visit (INDEPENDENT_AMBULATORY_CARE_PROVIDER_SITE_OTHER): Payer: Medicare HMO | Admitting: Surgery

## 2023-07-31 ENCOUNTER — Telehealth: Payer: Self-pay | Admitting: Surgery

## 2023-07-31 ENCOUNTER — Encounter: Payer: Self-pay | Admitting: Surgery

## 2023-07-31 VITALS — BP 162/83 | HR 56 | Temp 98.1°F | Ht 62.0 in | Wt 180.4 lb

## 2023-07-31 DIAGNOSIS — K449 Diaphragmatic hernia without obstruction or gangrene: Secondary | ICD-10-CM | POA: Diagnosis not present

## 2023-07-31 DIAGNOSIS — R1310 Dysphagia, unspecified: Secondary | ICD-10-CM

## 2023-07-31 DIAGNOSIS — K2100 Gastro-esophageal reflux disease with esophagitis, without bleeding: Secondary | ICD-10-CM | POA: Diagnosis not present

## 2023-07-31 DIAGNOSIS — K219 Gastro-esophageal reflux disease without esophagitis: Secondary | ICD-10-CM

## 2023-07-31 NOTE — Telephone Encounter (Signed)
Patient has been advised of Pre-Admission date/time, and Surgery date at [facility].  Surgery Date: 08/22/23 Preadmission Testing Date: 08/15/23 (phone 1p-4p)  Patient has been made aware to call [phone], between 1-3:00pm the day before surgery, to find out what time to arrive for surgery.

## 2023-07-31 NOTE — Patient Instructions (Signed)
Hiatal Hernia  A hiatal hernia occurs when part of the stomach slides above the muscle that separates the abdomen from the chest (diaphragm). A person can be born with a hiatal hernia (congenital), or it may develop over time. In almost all cases of hiatal hernia, only the top part of the stomach pushes through the diaphragm. Many people have a hiatal hernia with no symptoms. The larger the hernia, the more likely it is that you will have symptoms. In some cases, a hiatal hernia allows stomach acid to flow back into the tube that carries food from your mouth to your stomach (esophagus). This may cause heartburn symptoms. The development of heartburn symptoms may mean that you have a condition called gastroesophageal reflux disease (GERD). What are the causes? This condition is caused by a weakness in the opening (hiatus) where the esophagus passes through the diaphragm to attach to the upper part of the stomach. A person may be born with a weakness in the hiatus, or a weakness can develop over time. What increases the risk? This condition is more likely to develop in: Older people. Age is a major risk factor for a hiatal hernia, especially if you are over the age of 74. Pregnant women. People who are overweight. People who have frequent constipation. What are the signs or symptoms? Symptoms of this condition usually develop in the form of GERD symptoms. Symptoms include: Heartburn. Upset stomach (indigestion). Trouble swallowing. Coughing or wheezing. Wheezing is making high-pitched whistling sounds when you breathe. Sore throat. Chest pain. Nausea and vomiting. How is this diagnosed? This condition may be diagnosed during testing for GERD. Tests that may be done include: X-rays of your stomach or chest. An upper gastrointestinal (GI) series. This is an X-ray exam of your GI tract that is taken after you swallow a chalky liquid that shows up clearly on the X-ray. Endoscopy. This is a  procedure to look into your stomach using a thin, flexible tube that has a tiny camera and light on the end of it. How is this treated? This condition may be treated by: Dietary and lifestyle changes to help reduce GERD symptoms. Medicines. These may include: Over-the-counter antacids. Medicines that make your stomach empty more quickly. Medicines that block the production of stomach acid (H2 blockers). Stronger medicines to reduce stomach acid (proton pump inhibitors). Surgery to repair the hernia, if other treatments are not helping. If you have no symptoms, you may not need treatment. Follow these instructions at home: Lifestyle and activity Do not use any products that contain nicotine or tobacco. These products include cigarettes, chewing tobacco, and vaping devices, such as e-cigarettes. If you need help quitting, ask your health care provider. Try to achieve and maintain a healthy body weight. Avoid putting pressure on your abdomen. Anything that puts pressure on your abdomen increases the amount of acid that may be pushed up into your esophagus. Avoid bending over, especially after eating. Raise the head of your bed by putting blocks under the legs. This keeps your head and esophagus higher than your stomach. Do not wear tight clothing around your chest or stomach. Try not to strain when having a bowel movement, when urinating, or when lifting heavy objects. Eating and drinking Avoid foods that can worsen GERD symptoms. These may include: Fatty foods, like fried foods. Citrus fruits, like oranges or lemon. Other foods and drinks that contain acid, like orange juice or tomatoes. Spicy food. Chocolate. Eat frequent small meals instead of three large meals a  day. This helps prevent your stomach from getting too full. Eat slowly. Do not lie down right after eating. Do not eat 1-2 hours before bed. Do not drink beverages with caffeine. These include cola, coffee, cocoa, and tea. Do  not drink alcohol. General instructions Take over-the-counter and prescription medicines only as told by your health care provider. Keep all follow-up visits. Your health care provider will want to check that any new prescribed medicines are helping your symptoms. Contact a health care provider if: Your symptoms are not controlled with medicines or lifestyle changes. You are having trouble swallowing. You have coughing or wheezing that will not go away. Your pain is getting worse. Your pain spreads to your arms, neck, jaw, teeth, or back. You feel nauseous or you vomit. Get help right away if: You have shortness of breath. You vomit blood. You have bright red blood in your stools. You have black, tarry stools. These symptoms may be an emergency. Get help right away. Call 911. Do not wait to see if the symptoms will go away. Do not drive yourself to the hospital. Summary A hiatal hernia occurs when part of the stomach slides above the muscle that separates the abdomen from the chest. A person may be born with a weakness in the hiatus, or a weakness can develop over time. Symptoms of a hiatal hernia may include heartburn, trouble swallowing, or sore throat. Management of a hiatal hernia includes eating frequent small meals instead of three large meals a day. Get help right away if you vomit blood, have bright red blood in your stools, or have black, tarry stools. This information is not intended to replace advice given to you by your health care provider. Make sure you discuss any questions you have with your health care provider. Document Revised: 09/14/2021 Document Reviewed: 09/14/2021 Elsevier Patient Education  2024 [employer].

## 2023-08-01 ENCOUNTER — Encounter: Payer: Self-pay | Admitting: Surgery

## 2023-08-01 NOTE — H&P (View-Only) (Signed)
Outpatient Surgical Follow Up    Elizabeth Mann is an 67 y.o. female.   Chief Complaint  Patient presents with   Follow-up    Hiatal hernia     HPI: Elizabeth Mann is a 67 y.o. female seen in consultation at the request of Dr.Aleskerov for symptomatic paraesophageal hernia.  She does have a history of chronic bronchitis and chronic cough.  Recently placed on a PPI with improvement of some pulmonary symptoms.  She does have history of reflux. She did have a recent CT of the chest that I have personally reviewed showing evidence of a moderate paraesophageal hernia.  No evidence of complicating features. She did complete her upper endoscopy as well as a barium swallow that I personally reviewed showing evidence of the hiatal hernia without any major complicating features.  She does have some esophageal dysmotility  Past Medical History:  Diagnosis Date   Anxiety    Depression    GERD (gastroesophageal reflux disease)    GERD with esophagitis    Hiatal hernia with gastroesophageal reflux    Hyperlipidemia    Hypertension    Insomnia    PONV (postoperative nausea and vomiting)    Sleep apnea     Past Surgical History:  Procedure Laterality Date   ABDOMINAL HYSTERECTOMY     BREAST BIOPSY Right    neg   ESOPHAGOGASTRODUODENOSCOPY (EGD) WITH PROPOFOL N/A 07/14/2023   Procedure: ESOPHAGOGASTRODUODENOSCOPY (EGD) WITH BIOPSY;  Surgeon: Midge Minium, MD;  Location[facility];  Service: Endoscopy;  Laterality: N/A;   OOPHORECTOMY     THORACIC LAMINECTOMY FOR SPINAL CORD STIMULATOR N/A 10/11/2021   Procedure: THORACIC LAMINECTOMY FOR SPINAL CORD STIMULATOR ([employer]);  Surgeon: Venetia Night, MD;  Location: [facility];  Service: Neurosurgery;  Laterality: N/A;    Family History  Problem Relation Age of Onset   Breast cancer Neg Hx     Social History:  reports that she has never smoked. She has never used smokeless tobacco. She reports that she does not drink  alcohol and does not use drugs.  Allergies:  Allergies  Allergen Reactions   Prednisone Other (See Comments)    headache   Contrast Media [Iodinated Contrast Media] Hives    Per patient, the healthcare providers were not sure if the oral contrast or the IV contrast causes hives/ rashes/itching.     Medications reviewed.    ROS Full ROS performed and is otherwise negative other than what is stated in HPI   BP (!) 162/83   Pulse (!) 56   Temp 98.1 F (36.7 C)   Ht 5\' 2"  (1.575 m)   Wt 180 lb 6.4 oz (81.8 kg)   SpO2 99%   BMI 33.00 kg/m   Physical Exam CONSTITUTIONAL: NAD. EYES: Pupils are equal, round, and reactive to light, Sclera are non-icteric. EARS, NOSE, MOUTH AND THROAT: The oropharynx is clear. The oral mucosa is pink and moist. Hearing is intact to voice. LYMPH NODES:  Lymph nodes in the neck are normal. RESPIRATORY:  Lungs are clear. There is normal respiratory effort, with equal breath sounds bilaterally, and without pathologic use of accessory muscles. CARDIOVASCULAR: Heart is regular without murmurs, gallops, or rubs. GI: The abdomen is  soft, nontender, and nondistended. There are no palpable masses. There is no hepatosplenomegaly. There are normal bowel sounds in all quadrants. GU: Rectal deferred.   MUSCULOSKELETAL: Normal muscle strength and tone. No cyanosis or edema.   SKIN: Turgor is good and there are no  pathologic skin lesions or ulcers. NEUROLOGIC: Motor and sensation is grossly normal. Cranial nerves are grossly intact. PSYCH:  Oriented to person, place and time. Affect is normal.  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  Discussed with the patient in detail about her disease process.  Options of continuation with medical management versus potential surgical repair.  The patient seems to be very interested in a surgical approach. After recent workup was  completed I do think that she is a good candidate for robotic approach with repair of paraesophageal hernia and partial fundoplication.   I did discuss with her in detail about the procedure.  The risk, benefits and possible implications as well as recovery and diet medication.  We also discussed with her in detail about the possible complications including but not limited to: Bleeding, infection, bowel or esophageal injuries that may require further interventions.  She seems to be understanding.  Please note I spent 40 minutes in this encounter including personally reviewing imaging studies, extensive review of medical records, placing orders and performing documentation    Sterling Big, MD [facility]

## 2023-08-15 ENCOUNTER — Other Ambulatory Visit: Payer: Self-pay

## 2023-08-15 ENCOUNTER — Encounter
Admission: RE | Admit: 2023-08-15 | Discharge: 2023-08-15 | Disposition: A | Discharge status: Home / Self Care | Payer: Medicare HMO | Source: Ambulatory Visit | Attending: Surgery | Admitting: Surgery

## 2023-08-15 HISTORY — DX: Unspecified asthma, uncomplicated: J45.909

## 2023-08-15 NOTE — Patient Instructions (Addendum)
 Your procedure is scheduled on: Tuesday 08/22/23 To find out your arrival time, please call [phone] between 1PM - 3PM on:  Monday 08/21/23  Report to the Registration Desk on the 1st floor of the Medical Mall. FREE Valet parking is available.  If your arrival time is 6:00 am, do not arrive before that time as the [facility] entrance doors do not open until 6:00 am.  REMEMBER: Instructions that are not followed completely may result in serious medical risk, up to and including death; or upon the discretion of your surgeon and anesthesiologist your surgery may need to be rescheduled.  Do not eat food after midnight the night before surgery.  No gum chewing or hard candies.  You may however, drink CLEAR liquids up to 2 hours before you are scheduled to arrive for your surgery. Do not drink anything within 2 hours of your scheduled arrival time.  Clear liquids include: - water   - apple juice without pulp - gatorade (not RED colors) - black coffee or tea (Do NOT add milk or creamers to the coffee or tea) Do NOT drink anything that is not on this list.  Type 1 and Type 2 diabetics should only drink water .  One week prior to surgery: Stop Anti-inflammatories (NSAIDS) such as Advil, Aleve, Ibuprofen, Motrin, Naproxen, Naprosyn and Aspirin based products such as Excedrin, Goody's Powder, BC Powder. You may however, continue to take Tylenol  if needed for pain up until the day of surgery.  Stop ANY OVER THE COUNTER supplements and vitamins until after surgery.  Continue taking all prescribed medications. Stop your Aspirin on Thursday 08/17/23 per Dr Dolph instructions.  TAKE ONLY THESE MEDICATIONS THE MORNING OF SURGERY WITH A SIP OF WATER :  atorvastatin (LIPITOR) 10 MG tablet  estradiol (ESTRACE) 0.5 MG tablet  fexofenadine (ALLEGRA) 180 MG table  pantoprazole (PROTONIX) 40 MG tablet Antacid (take one the night before and one on the morning of surgery - helps to prevent nausea  after surgery.)  Use inhaler (Breo-Ellipta) on the day of surgery and bring your Albuterol inhaler to the hospital.  No Alcohol for 24 hours before or after surgery.  No Smoking including e-cigarettes for 24 hours before surgery.  No chewable tobacco products for at least 6 hours before surgery.  No nicotine patches on the day of surgery.  Do not use any recreational drugs for at least a week (preferably 2 weeks) before your surgery.  Please be advised that the combination of cocaine and anesthesia may have negative outcomes, up to and including death. If you test positive for cocaine, your surgery will be cancelled.  On the morning of surgery brush your teeth with toothpaste and water , you may rinse your mouth with mouthwash if you wish. Do not swallow any toothpaste or mouthwash.  Use CHG Soap or wipes as directed on instruction sheet.  Do not wear lotions, powders, or perfumes.   Do not shave body hair from the neck down 48 hours before surgery.  Wear clean comfortable clothing (specific to your surgery type) to the hospital.  Do not wear jewelry, make-up, hairpins, clips or nail polish.  For welded (permanent) jewelry: bracelets, anklets, waist bands, etc.  Please have this removed prior to surgery.  If it is not removed, there is a chance that hospital personnel will need to cut it off on the day of surgery. Contact lenses, hearing aids and dentures may not be worn into surgery.  Do not bring valuables to the hospital. [facility]  is not responsible for any missing/lost belongings or valuables.   Notify your doctor if there is any change in your medical condition (cold, fever, infection).  If you are being discharged the day of surgery, you will not be allowed to drive home. You will need a responsible individual to drive you home and stay with you for 24 hours after surgery.   If you are taking public transportation, you will need to have a responsible individual with  you.  If you are being admitted to the hospital overnight, leave your suitcase in the car. After surgery it may be brought to your room.  In case of increased patient census, it may be necessary for you, the patient, to continue your postoperative care in the Same Day Surgery department.  After surgery, you can help prevent lung complications by doing breathing exercises.  Take deep breaths and cough every 1-2 hours. Your doctor may order a device called an Incentive Spirometer to help you take deep breaths. When coughing or sneezing, hold a pillow firmly against your incision with both hands. This is called "splinting." Doing this helps protect your incision. It also decreases belly discomfort.  Surgery Visitation Policy:  Patients undergoing a surgery or procedure may have two family members or support persons with them as long as the person is not COVID-19 positive or experiencing its symptoms.   Inpatient Visitation:    Visiting hours are 7 a.m. to 8 p.m. Up to four visitors are allowed at one time in a patient room. The visitors may rotate out with other people during the day. One designated support person (adult) may remain overnight.  Due to an increase in RSV and influenza rates and associated hospitalizations, children ages 70 and under will not be able to visit patients in [facility]. Masks continue to be strongly recommended.  Please call the Pre-admissions Testing Dept. at [phone] if you have any questions about these instructions.     Preparing for Surgery with CHLORHEXIDINE  GLUCONATE (CHG) Soap  Chlorhexidine  Gluconate (CHG) Soap  o An antiseptic cleaner that kills germs and bonds with the skin to continue killing germs even after washing  o Used for showering the night before surgery and morning of surgery  Before surgery, you can play an important role by reducing the number of germs on your skin.  CHG (Chlorhexidine  gluconate) soap is an  antiseptic cleanser which kills germs and bonds with the skin to continue killing germs even after washing.  Please do not use if you have an allergy to CHG or antibacterial soaps. If your skin becomes reddened/irritated stop using the CHG.  1. Shower the NIGHT BEFORE SURGERY and the MORNING OF SURGERY with CHG soap.  2. If you choose to wash your hair, wash your hair first as usual with your normal shampoo.  3. After shampooing, rinse your hair and body thoroughly to remove the shampoo.  4. Use CHG as you would any other liquid soap. You can apply CHG directly to the skin and wash gently with a scrungie or a clean washcloth.  5. Apply the CHG soap to your body only from the neck down. Do not use on open wounds or open sores. Avoid contact with your eyes, ears, mouth, and genitals (private parts). Wash face and genitals (private parts) with your normal soap.  6. Wash thoroughly, paying special attention to the area where your surgery will be performed.  7. Thoroughly rinse your body with warm water .  8. Do  not shower/wash with your normal soap after using and rinsing off the CHG soap.  9. Pat yourself dry with a clean towel.  10. Wear clean pajamas to bed the night before surgery.  12. Place clean sheets on your bed the night of your first shower and do not sleep with pets.  13. Shower again with the CHG soap on the day of surgery prior to arriving at the hospital.  14. Do not apply any deodorants/lotions/powders.  15. Please wear clean clothes to the hospital.

## 2023-08-22 ENCOUNTER — Encounter
Admission: RE | Disposition: A | Discharge status: Home / Self Care | Payer: Self-pay | Source: Home / Self Care | Attending: Surgery

## 2023-08-22 ENCOUNTER — Encounter: Payer: Self-pay | Admitting: Surgery

## 2023-08-22 ENCOUNTER — Ambulatory Visit: Payer: Medicare HMO | Admitting: Urgent Care

## 2023-08-22 ENCOUNTER — Observation Stay
Admission: RE | Admit: 2023-08-22 | Discharge: 2023-08-23 | Disposition: A | Discharge status: Home / Self Care | Payer: Medicare HMO | Attending: Surgery | Admitting: Surgery

## 2023-08-22 ENCOUNTER — Ambulatory Visit: Payer: Medicare HMO | Admitting: Certified Registered"

## 2023-08-22 ENCOUNTER — Other Ambulatory Visit: Payer: Self-pay

## 2023-08-22 DIAGNOSIS — K219 Gastro-esophageal reflux disease without esophagitis: Secondary | ICD-10-CM | POA: Diagnosis not present

## 2023-08-22 DIAGNOSIS — K449 Diaphragmatic hernia without obstruction or gangrene: Secondary | ICD-10-CM | POA: Diagnosis present

## 2023-08-22 DIAGNOSIS — I10 Essential (primary) hypertension: Secondary | ICD-10-CM | POA: Insufficient documentation

## 2023-08-22 DIAGNOSIS — Z8719 Personal history of other diseases of the digestive system: Principal | ICD-10-CM

## 2023-08-22 HISTORY — PX: XI ROBOTIC ASSISTED PARAESOPHAGEAL HERNIA REPAIR: SHX6871

## 2023-08-22 HISTORY — PX: INSERTION OF MESH: SHX5868

## 2023-08-22 LAB — CBC
HCT: 40.5 % (ref 36.0–46.0)
Hemoglobin: 12.8 g/dL (ref 12.0–15.0)
MCH: 28.4 pg (ref 26.0–34.0)
MCHC: 31.6 g/dL (ref 30.0–36.0)
MCV: 90 fL (ref 80.0–100.0)
Platelets: 320 10*3/uL (ref 150–400)
RBC: 4.5 MIL/uL (ref 3.87–5.11)
RDW: 14.5 % (ref 11.5–15.5)
WBC: 16.5 10*3/uL — ABNORMAL HIGH (ref 4.0–10.5)
nRBC: 0 % (ref 0.0–0.2)

## 2023-08-22 LAB — CREATININE, SERUM
Creatinine, Ser: 0.92 mg/dL (ref 0.44–1.00)
GFR, Estimated: >60 mL/min (ref >60)

## 2023-08-22 LAB — HIV ANTIBODY (ROUTINE TESTING W REFLEX): HIV Screen 4th Generation wRfx: NONREACTIVE

## 2023-08-22 SURGERY — REPAIR, HERNIA, PARAESOPHAGEAL, ROBOT-ASSISTED
Anesthesia: General | Site: Abdomen

## 2023-08-22 MED ORDER — CEFAZOLIN SODIUM-DEXTROSE 2-4 GM/100ML-% IV SOLN
INTRAVENOUS | Status: AC
  Filled 2023-08-22: qty 100

## 2023-08-22 MED ORDER — SUGAMMADEX SODIUM 200 MG/2ML IV SOLN
INTRAVENOUS | Status: DC | PRN
  Administered 2023-08-22: 160 mg via INTRAVENOUS

## 2023-08-22 MED ORDER — EPHEDRINE 5 MG/ML INJ
INTRAVENOUS | Status: AC
  Filled 2023-08-22: qty 5

## 2023-08-22 MED ORDER — OXYCODONE HCL 5 MG PO TABS
ORAL_TABLET | ORAL | Status: AC
  Filled 2023-08-22: qty 1

## 2023-08-22 MED ORDER — GABAPENTIN 100 MG PO CAPS
300.0000 mg | ORAL_CAPSULE | Freq: Every day | ORAL | Status: DC
  Administered 2023-08-22: 300 mg via ORAL

## 2023-08-22 MED ORDER — PROPOFOL 1000 MG/100ML IV EMUL
INTRAVENOUS | Status: AC
  Filled 2023-08-22: qty 100

## 2023-08-22 MED ORDER — ONDANSETRON HCL 4 MG/2ML IJ SOLN: 4.0000 mg | Freq: Once | INTRAMUSCULAR | Status: DC | PRN

## 2023-08-22 MED ORDER — METHYLENE BLUE (ANTIDOTE) 1 % IV SOLN
INTRAVENOUS | Status: AC
  Filled 2023-08-22: qty 10

## 2023-08-22 MED ORDER — DIPHENHYDRAMINE HCL 50 MG/ML IJ SOLN: 25.0000 mg | Freq: Four times a day (QID) | INTRAMUSCULAR | Status: DC | PRN

## 2023-08-22 MED ORDER — MELATONIN 5 MG PO TABS: 2.5000 mg | ORAL_TABLET | Freq: Every evening | ORAL | Status: DC | PRN

## 2023-08-22 MED ORDER — DEXAMETHASONE SODIUM PHOSPHATE 10 MG/ML IJ SOLN
INTRAMUSCULAR | Status: AC
  Filled 2023-08-22: qty 1

## 2023-08-22 MED ORDER — ONDANSETRON 4 MG PO TBDP: 4.0000 mg | ORAL_TABLET | Freq: Four times a day (QID) | ORAL | Status: DC | PRN

## 2023-08-22 MED ORDER — GLYCOPYRROLATE 0.2 MG/ML IJ SOLN
INTRAMUSCULAR | Status: AC
  Filled 2023-08-22: qty 1

## 2023-08-22 MED ORDER — CEFAZOLIN SODIUM-DEXTROSE 2-4 GM/100ML-% IV SOLN
2.0000 g | INTRAVENOUS | Status: AC
  Administered 2023-08-22: 2 g via INTRAVENOUS

## 2023-08-22 MED ORDER — PROCHLORPERAZINE MALEATE 10 MG PO TABS: 10.0000 mg | ORAL_TABLET | Freq: Four times a day (QID) | ORAL | Status: DC | PRN

## 2023-08-22 MED ORDER — ACETAMINOPHEN 500 MG PO TABS
ORAL_TABLET | ORAL | Status: AC
  Filled 2023-08-22: qty 2

## 2023-08-22 MED ORDER — HYDRALAZINE HCL 20 MG/ML IJ SOLN: 10.0000 mg | INTRAMUSCULAR | Status: DC | PRN

## 2023-08-22 MED ORDER — OXYCODONE HCL 5 MG/5ML PO SOLN
ORAL | Status: AC
  Filled 2023-08-22: qty 5

## 2023-08-22 MED ORDER — VISTASEAL 4 ML SINGLE DOSE KIT
PACK | CUTANEOUS | Status: DC | PRN
  Administered 2023-08-22: 4 mL via TOPICAL

## 2023-08-22 MED ORDER — ROCURONIUM BROMIDE 100 MG/10ML IV SOLN
INTRAVENOUS | Status: DC | PRN
  Administered 2023-08-22 (×2): 10 mg via INTRAVENOUS
  Administered 2023-08-22: 50 mg via INTRAVENOUS

## 2023-08-22 MED ORDER — BUPIVACAINE LIPOSOME 1.3 % IJ SUSP
INTRAMUSCULAR | Status: DC | PRN
  Administered 2023-08-22: 20 mL

## 2023-08-22 MED ORDER — SODIUM CHLORIDE 0.9 % IV SOLN: INTRAVENOUS | Status: DC

## 2023-08-22 MED ORDER — FENTANYL CITRATE (PF) 100 MCG/2ML IJ SOLN
INTRAMUSCULAR | Status: DC | PRN
  Administered 2023-08-22: 100 ug via INTRAVENOUS

## 2023-08-22 MED ORDER — DIPHENHYDRAMINE HCL 25 MG PO CAPS: 25.0000 mg | ORAL_CAPSULE | Freq: Four times a day (QID) | ORAL | Status: DC | PRN

## 2023-08-22 MED ORDER — ORAL CARE MOUTH RINSE: 15.0000 mL | Freq: Once | OROMUCOSAL | Status: AC

## 2023-08-22 MED ORDER — PHENYLEPHRINE HCL-NACL 20-0.9 MG/250ML-% IV SOLN
INTRAVENOUS | Status: AC
  Filled 2023-08-22: qty 250

## 2023-08-22 MED ORDER — PHENYLEPHRINE HCL-NACL 20-0.9 MG/250ML-% IV SOLN
INTRAVENOUS | Status: DC | PRN
  Administered 2023-08-22: 80 ug/min via INTRAVENOUS

## 2023-08-22 MED ORDER — PROPOFOL 10 MG/ML IV BOLUS
INTRAVENOUS | Status: DC | PRN
  Administered 2023-08-22: 120 mg via INTRAVENOUS
  Administered 2023-08-22: 120 ug/kg/min via INTRAVENOUS

## 2023-08-22 MED ORDER — ONDANSETRON HCL 4 MG/2ML IJ SOLN
INTRAMUSCULAR | Status: AC
  Filled 2023-08-22: qty 2

## 2023-08-22 MED ORDER — ROCURONIUM BROMIDE 10 MG/ML (PF) SYRINGE
PREFILLED_SYRINGE | INTRAVENOUS | Status: AC
  Filled 2023-08-22: qty 10

## 2023-08-22 MED ORDER — KETOROLAC TROMETHAMINE 15 MG/ML IJ SOLN
15.0000 mg | Freq: Four times a day (QID) | INTRAMUSCULAR | Status: DC
  Administered 2023-08-22 – 2023-08-23 (×4): 15 mg via INTRAVENOUS

## 2023-08-22 MED ORDER — OXYCODONE HCL 5 MG PO TABS
5.0000 mg | ORAL_TABLET | ORAL | Status: DC | PRN
  Administered 2023-08-22 – 2023-08-23 (×3): 5 mg via ORAL
  Administered 2023-08-23: 10 mg via ORAL
  Administered 2023-08-23: 5 mg via ORAL

## 2023-08-22 MED ORDER — ONDANSETRON HCL 4 MG/2ML IJ SOLN: 4.0000 mg | Freq: Four times a day (QID) | INTRAMUSCULAR | Status: DC | PRN

## 2023-08-22 MED ORDER — KETOROLAC TROMETHAMINE 15 MG/ML IJ SOLN
INTRAMUSCULAR | Status: AC
  Filled 2023-08-22: qty 1

## 2023-08-22 MED ORDER — CHLORHEXIDINE GLUCONATE CLOTH 2 % EX PADS
6.0000 | MEDICATED_PAD | Freq: Once | CUTANEOUS | Status: AC
Start: 2023-08-22 — End: 2023-08-22
  Administered 2023-08-22: 6 via TOPICAL

## 2023-08-22 MED ORDER — ONDANSETRON HCL 4 MG/2ML IJ SOLN
INTRAMUSCULAR | Status: DC | PRN
  Administered 2023-08-22: 4 mg via INTRAVENOUS

## 2023-08-22 MED ORDER — VISTASEAL 10 ML SINGLE DOSE KIT
PACK | CUTANEOUS | Status: AC
  Filled 2023-08-22: qty 10

## 2023-08-22 MED ORDER — BUPIVACAINE LIPOSOME 1.3 % IJ SUSP
INTRAMUSCULAR | Status: AC
  Filled 2023-08-22: qty 20

## 2023-08-22 MED ORDER — MIDAZOLAM HCL 2 MG/2ML IJ SOLN
INTRAMUSCULAR | Status: DC | PRN
  Administered 2023-08-22: 2 mg via INTRAVENOUS

## 2023-08-22 MED ORDER — FENTANYL CITRATE (PF) 100 MCG/2ML IJ SOLN
25.0000 ug | INTRAMUSCULAR | Status: AC | PRN
  Administered 2023-08-22 (×5): 25 ug via INTRAVENOUS
  Administered 2023-08-22: 50 ug via INTRAVENOUS

## 2023-08-22 MED ORDER — LIDOCAINE HCL (PF) 2 % IJ SOLN
INTRAMUSCULAR | Status: AC
  Filled 2023-08-22: qty 5

## 2023-08-22 MED ORDER — GABAPENTIN 100 MG PO CAPS
ORAL_CAPSULE | ORAL | Status: AC
  Filled 2023-08-22: qty 3

## 2023-08-22 MED ORDER — ACETAMINOPHEN 500 MG PO TABS
1000.0000 mg | ORAL_TABLET | Freq: Four times a day (QID) | ORAL | Status: DC
  Administered 2023-08-22 – 2023-08-23 (×4): 1000 mg via ORAL

## 2023-08-22 MED ORDER — OXYCODONE HCL 5 MG PO TABS
ORAL_TABLET | ORAL | Status: AC
  Filled 2023-08-22: qty 2

## 2023-08-22 MED ORDER — DEXAMETHASONE SODIUM PHOSPHATE 10 MG/ML IJ SOLN
INTRAMUSCULAR | Status: DC | PRN
  Administered 2023-08-22: 10 mg via INTRAVENOUS

## 2023-08-22 MED ORDER — FENTANYL CITRATE (PF) 100 MCG/2ML IJ SOLN
INTRAMUSCULAR | Status: AC
  Filled 2023-08-22: qty 2

## 2023-08-22 MED ORDER — CHLORHEXIDINE GLUCONATE CLOTH 2 % EX PADS: 6.0000 | MEDICATED_PAD | Freq: Once | CUTANEOUS | Status: DC

## 2023-08-22 MED ORDER — CELECOXIB 200 MG PO CAPS
ORAL_CAPSULE | ORAL | Status: AC
  Filled 2023-08-22: qty 1

## 2023-08-22 MED ORDER — TRAZODONE HCL 100 MG PO TABS
ORAL_TABLET | ORAL | Status: AC
  Filled 2023-08-22: qty 1

## 2023-08-22 MED ORDER — BUPIVACAINE-EPINEPHRINE 0.25% -1:200000 IJ SOLN
INTRAMUSCULAR | Status: DC | PRN
  Administered 2023-08-22: 30 mL

## 2023-08-22 MED ORDER — LACTATED RINGERS IV SOLN: INTRAVENOUS | Status: DC

## 2023-08-22 MED ORDER — ACETAMINOPHEN 500 MG PO TABS
1000.0000 mg | ORAL_TABLET | ORAL | Status: AC
  Administered 2023-08-22: 1000 mg via ORAL

## 2023-08-22 MED ORDER — STERILE WATER FOR IRRIGATION IR SOLN
Status: DC | PRN
  Administered 2023-08-22: 500 mL

## 2023-08-22 MED ORDER — CEFAZOLIN SODIUM-DEXTROSE 2-4 GM/100ML-% IV SOLN
2.0000 g | Freq: Three times a day (TID) | INTRAVENOUS | Status: AC
Start: 2023-08-22 — End: 2023-08-23
  Administered 2023-08-22 – 2023-08-23 (×3): 2 g via INTRAVENOUS

## 2023-08-22 MED ORDER — CHLORHEXIDINE GLUCONATE 0.12 % MT SOLN
15.0000 mL | Freq: Once | OROMUCOSAL | Status: AC
  Administered 2023-08-22: 15 mL via OROMUCOSAL

## 2023-08-22 MED ORDER — OXYCODONE HCL 5 MG/5ML PO SOLN
5.0000 mg | Freq: Once | ORAL | Status: AC | PRN
  Administered 2023-08-22: 5 mg via ORAL

## 2023-08-22 MED ORDER — TRAZODONE HCL 100 MG PO TABS
100.0000 mg | ORAL_TABLET | Freq: Every day | ORAL | Status: DC
  Administered 2023-08-22: 100 mg via ORAL

## 2023-08-22 MED ORDER — DEXMEDETOMIDINE HCL IN NACL 80 MCG/20ML IV SOLN
INTRAVENOUS | Status: DC | PRN
  Administered 2023-08-22 (×4): 8 ug via INTRAVENOUS

## 2023-08-22 MED ORDER — LIDOCAINE HCL (CARDIAC) PF 100 MG/5ML IV SOSY
PREFILLED_SYRINGE | INTRAVENOUS | Status: DC | PRN
  Administered 2023-08-22: 100 mg via INTRAVENOUS

## 2023-08-22 MED ORDER — CHLORHEXIDINE GLUCONATE 0.12 % MT SOLN
OROMUCOSAL | Status: AC
  Filled 2023-08-22: qty 15

## 2023-08-22 MED ORDER — GLYCOPYRROLATE 0.2 MG/ML IJ SOLN
INTRAMUSCULAR | Status: DC | PRN
  Administered 2023-08-22: .2 mg via INTRAVENOUS

## 2023-08-22 MED ORDER — PROPOFOL 10 MG/ML IV BOLUS
INTRAVENOUS | Status: AC
  Filled 2023-08-22: qty 20

## 2023-08-22 MED ORDER — PHENYLEPHRINE 80 MCG/ML (10ML) SYRINGE FOR IV PUSH (FOR BLOOD PRESSURE SUPPORT)
PREFILLED_SYRINGE | INTRAVENOUS | Status: DC | PRN
  Administered 2023-08-22 (×5): 80 ug via INTRAVENOUS

## 2023-08-22 MED ORDER — VISTASEAL 4 ML SINGLE DOSE KIT
PACK | CUTANEOUS | Status: AC
  Filled 2023-08-22: qty 4

## 2023-08-22 MED ORDER — ENOXAPARIN SODIUM 40 MG/0.4ML IJ SOSY
40.0000 mg | PREFILLED_SYRINGE | INTRAMUSCULAR | Status: DC
  Administered 2023-08-23: 40 mg via SUBCUTANEOUS

## 2023-08-22 MED ORDER — OXYCODONE HCL 5 MG PO TABS: 5.0000 mg | ORAL_TABLET | Freq: Once | ORAL | Status: AC | PRN

## 2023-08-22 MED ORDER — BUPIVACAINE-EPINEPHRINE (PF) 0.25% -1:200000 IJ SOLN
INTRAMUSCULAR | Status: AC
  Filled 2023-08-22: qty 30

## 2023-08-22 MED ORDER — EPHEDRINE SULFATE-NACL 50-0.9 MG/10ML-% IV SOSY
PREFILLED_SYRINGE | INTRAVENOUS | Status: DC | PRN
  Administered 2023-08-22: 10 mg via INTRAVENOUS

## 2023-08-22 MED ORDER — PROCHLORPERAZINE EDISYLATE 10 MG/2ML IJ SOLN: 5.0000 mg | Freq: Four times a day (QID) | INTRAMUSCULAR | Status: DC | PRN

## 2023-08-22 MED ORDER — MORPHINE SULFATE (PF) 2 MG/ML IV SOLN: 2.0000 mg | INTRAVENOUS | Status: DC | PRN

## 2023-08-22 MED ORDER — CELECOXIB 200 MG PO CAPS
200.0000 mg | ORAL_CAPSULE | ORAL | Status: AC
  Administered 2023-08-22: 200 mg via ORAL

## 2023-08-22 MED ORDER — MIDAZOLAM HCL 2 MG/2ML IJ SOLN
INTRAMUSCULAR | Status: AC
Start: 2023-08-22 — End: ?
  Filled 2023-08-22: qty 2

## 2023-08-22 SURGICAL SUPPLY — 48 items
APPLICATOR VISTASEAL 35 (MISCELLANEOUS) IMPLANT
CANNULA REDUCER 12-8 DVNC XI (CANNULA) ×1 IMPLANT
CLIP LIGATING HEM O LOK PURPLE (MISCELLANEOUS) IMPLANT
DERMABOND ADVANCED .7 DNX12 (GAUZE/BANDAGES/DRESSINGS) ×1 IMPLANT
DRAPE ARM DVNC X/XI (DISPOSABLE) ×4 IMPLANT
DRAPE COLUMN DVNC XI (DISPOSABLE) ×1 IMPLANT
ELECT REM PT RETURN 9FT ADLT (ELECTROSURGICAL) ×1
ELECTRODE REM PT RTRN 9FT ADLT (ELECTROSURGICAL) ×1 IMPLANT
FORCEPS BPLR R/ABLATION 8 DVNC (INSTRUMENTS) ×1 IMPLANT
GLOVE BIO SURGEON STRL SZ7 (GLOVE) ×3 IMPLANT
GOWN STRL REUS W/ TWL LRG LVL3 (GOWN DISPOSABLE) ×4 IMPLANT
GRASPER LAPSCPC 5X45 DSP (INSTRUMENTS) ×1 IMPLANT
GRASPER TIP-UP FEN DVNC XI (INSTRUMENTS) ×1 IMPLANT
IRRIGATION STRYKERFLOW (MISCELLANEOUS) IMPLANT
IRRIGATOR STRYKERFLOW (MISCELLANEOUS) ×1
KIT IMAGING PINPOINTPAQ (MISCELLANEOUS) ×1 IMPLANT
KIT PINK PAD W/HEAD ARE REST (MISCELLANEOUS) ×1
KIT PINK PAD W/HEAD ARM REST (MISCELLANEOUS) ×1 IMPLANT
LABEL OR SOLS (LABEL) ×1 IMPLANT
MANIFOLD NEPTUNE II (INSTRUMENTS) ×1 IMPLANT
MESH BIO-A 7X10 SYN MAT (Mesh General) IMPLANT
NDL DRIVE SUT CUT DVNC (INSTRUMENTS) ×1 IMPLANT
NDL HYPO 22X1.5 SAFETY MO (MISCELLANEOUS) ×1 IMPLANT
NEEDLE DRIVE SUT CUT DVNC (INSTRUMENTS) ×1
NEEDLE HYPO 22X1.5 SAFETY MO (MISCELLANEOUS) ×1
OBTURATOR OPTICAL STND 8 DVNC (TROCAR) ×1
OBTURATOR OPTICALSTD 8 DVNC (TROCAR) ×1 IMPLANT
PACK LAP CHOLECYSTECTOMY (MISCELLANEOUS) ×1 IMPLANT
SEAL UNIV 5-12 XI (MISCELLANEOUS) ×4 IMPLANT
SEALER VESSEL EXT DVNC XI (MISCELLANEOUS) ×1 IMPLANT
SOL ELECTROSURG ANTI STICK (MISCELLANEOUS) ×1
SOLUTION ELECTROSURG ANTI STCK (MISCELLANEOUS) ×1 IMPLANT
SPONGE T-LAP 18X18 ~~LOC~~+RFID (SPONGE) ×1 IMPLANT
SUT MNCRL 4-0 27 PS-2 XMFL (SUTURE) ×2
SUT MNCRL 4-0 27XMFL (SUTURE) ×2
SUT SILK 2 0 SH (SUTURE) ×2 IMPLANT
SUT STRATA 2-0 23CM CT-2 (SUTURE) ×1 IMPLANT
SUT VIC AB 3-0 SH 27X BRD (SUTURE) IMPLANT
SUT VICRYL 0 UR6 27IN ABS (SUTURE) ×2 IMPLANT
SUTURE MNCRL 4-0 27XMF (SUTURE) ×1 IMPLANT
SYR 30ML LL (SYRINGE) ×1 IMPLANT
SYR TOOMEY IRRIG 70ML (MISCELLANEOUS) ×1
SYRINGE TOOMEY IRRIG 70ML (MISCELLANEOUS) ×1 IMPLANT
SYS BAG RETRIEVAL 10MM (BASKET)
SYSTEM BAG RETRIEVAL 10MM (BASKET) IMPLANT
TRAY FOLEY SLVR 16FR LF STAT (SET/KITS/TRAYS/PACK) ×1 IMPLANT
TROCAR Z-THREAD FIOS 5X100MM (TROCAR) ×1 IMPLANT
TUBING EVAC SMOKE HEATED PNEUM (TUBING) ×1 IMPLANT

## 2023-08-22 NOTE — Plan of Care (Signed)
  Problem: Safety: Goal: Ability to remain free from injury will improve Outcome: Progressing   Problem: Skin Integrity: Goal: Risk for impaired skin integrity will decrease Outcome: Progressing   

## 2023-08-22 NOTE — Interval H&P Note (Signed)
History and Physical Interval Note:  08/22/2023 10:51 AM  Elizabeth Mann  has presented today for surgery, with the diagnosis of paraesophageal hernia.  The various methods of treatment have been discussed with the patient and family. After consideration of risks, benefits and other options for treatment, the patient has consented to  Procedure(s): XI ROBOTIC ASSISTED PARAESOPHAGEAL HERNIA REPAIR, RNFA to assist (N/A) as a surgical intervention.  The patient's history has been reviewed, patient examined, no change in status, stable for surgery.  I have reviewed the patient's chart and labs.  Questions were answered to the patient's satisfaction.     Loy Mccartt F Cosme Jacob

## 2023-08-22 NOTE — Anesthesia Postprocedure Evaluation (Signed)
Anesthesia Post Note  Patient: RAYGAN DEDON  Procedure(s) Performed: XI ROBOTIC ASSISTED PARAESOPHAGEAL HERNIA REPAIR, RNFA to assist (Abdomen) INSERTION OF MESH (Abdomen)  Patient location during evaluation: PACU Anesthesia Type: General Level of consciousness: awake and alert Pain management: pain level controlled Vital Signs Assessment: post-procedure vital signs reviewed and stable Respiratory status: spontaneous breathing, nonlabored ventilation, respiratory function stable and patient connected to nasal cannula oxygen Cardiovascular status: blood pressure returned to baseline and stable Postop Assessment: no apparent nausea or vomiting Anesthetic complications: no   No notable events documented.   Last Vitals:  Vitals:   08/22/23 1410 08/22/23 1415  BP:    Pulse: 73 64  Resp: 11 13  Temp:    SpO2: 93% 92%    Last Pain:  Vitals:   08/22/23 1415  TempSrc:   PainSc: 5                  Corinda Gubler

## 2023-08-22 NOTE — Anesthesia Procedure Notes (Signed)
Procedure Name: Intubation Date/Time: 08/22/2023 11:37 AM  Performed by: Morene Crocker, CRNAPre-anesthesia Checklist: Patient identified, Patient being monitored, Timeout performed, Emergency Drugs available and Suction available Patient Re-evaluated:Patient Re-evaluated prior to induction Oxygen Delivery Method: Circle system utilized Preoxygenation: Pre-oxygenation with 100% oxygen Induction Type: IV induction Ventilation: Mask ventilation without difficulty Laryngoscope Size: 3 and McGrath Grade View: Grade I Tube type: Oral Tube size: 6.5 mm Number of attempts: 1 Airway Equipment and Method: Stylet Placement Confirmation: ETT inserted through vocal cords under direct vision, positive ETCO2 and breath sounds checked- equal and bilateral Secured at: 21 cm Tube secured with: Tape Dental Injury: Teeth and Oropharynx as per pre-operative assessment  Comments: Smooth, atraumatic ETT placement, no complications noted.

## 2023-08-22 NOTE — Transfer of Care (Signed)
Immediate Anesthesia Transfer of Care Note  Patient: Elizabeth Mann  Procedure(s) Performed: XI ROBOTIC ASSISTED PARAESOPHAGEAL HERNIA REPAIR, RNFA to assist (Abdomen) INSERTION OF MESH (Abdomen)  Patient Location: PACU  Anesthesia Type:General  Level of Consciousness: drowsy  Airway & Oxygen Therapy: Patient Spontanous Breathing and Patient connected to face mask oxygen  Post-op Assessment: Report given to RN and Post -op Vital signs reviewed and stable  Post vital signs: Reviewed and stable  Last Vitals:  Vitals Value Taken Time  BP 145/66 08/22/23 1332  Temp 35.9 1332  Pulse 76 08/22/23 1337  Resp 17 08/22/23 1337  SpO2 98 % 08/22/23 1337  Vitals shown include unfiled device data.  Last Pain:  Vitals:   08/22/23 0903  TempSrc: Temporal  PainSc: 0-No pain         Complications: No notable events documented.

## 2023-08-22 NOTE — Anesthesia Preprocedure Evaluation (Signed)
Anesthesia Evaluation  Patient identified by MRN, date of birth, ID band Patient awake    Reviewed: Allergy & Precautions, NPO status , Patient's Chart, lab work & pertinent test results  History of Anesthesia Complications (+) PONV and history of anesthetic complications  Airway Mallampati: II  TM Distance: >3 FB Neck ROM: Full    Dental  (+) Upper Dentures   Pulmonary asthma , neg sleep apnea, neg COPD, Patient abstained from smoking.Not current smoker   Pulmonary exam normal breath sounds clear to auscultation       Cardiovascular Exercise Tolerance: Good METShypertension, Pt. on medications (-) CAD and (-) Past MI (-) dysrhythmias  Rhythm:Regular Rate:Normal - Systolic murmurs    Neuro/Psych  Headaches PSYCHIATRIC DISORDERS Anxiety Depression       GI/Hepatic hiatal hernia,GERD  ,,(+)     (-) substance abuse    Endo/Other  neg diabetes    Renal/GU negative Renal ROS     Musculoskeletal  (+) Arthritis ,    Abdominal   Peds  Hematology   Anesthesia Other Findings Past Medical History: No date: Anxiety No date: Asthma No date: Depression No date: GERD (gastroesophageal reflux disease) No date: GERD with esophagitis No date: Hiatal hernia with gastroesophageal reflux No date: Hyperlipidemia No date: Hypertension No date: Insomnia No date: PONV (postoperative nausea and vomiting) No date: Sleep apnea     Comment:  pt denies sx thinks misunderstood for her insomnia  Reproductive/Obstetrics                             Anesthesia Physical Anesthesia Plan  ASA: 2  Anesthesia Plan: General   Post-op Pain Management: Tylenol PO (pre-op)* and Celebrex PO (pre-op)*   Induction: Intravenous  PONV Risk Score and Plan: 4 or greater and Ondansetron, Dexamethasone, TIVA, Propofol infusion and Midazolam  Airway Management Planned: Oral ETT and Video Laryngoscope Planned  Additional  Equipment: None  Intra-op Plan:   Post-operative Plan: Extubation in OR  Informed Consent: I have reviewed the patients History and Physical, chart, labs and discussed the procedure including the risks, benefits and alternatives for the proposed anesthesia with the patient or authorized representative who has indicated his/her understanding and acceptance.     Dental advisory given  Plan Discussed with: CRNA and Surgeon  Anesthesia Plan Comments: (Discussed risks of anesthesia with patient, including PONV, sore throat, lip/dental/eye damage. Rare risks discussed as well, such as cardiorespiratory and neurological sequelae, and allergic reactions. Discussed the role of CRNA in patient's perioperative care. Patient understands.)       Anesthesia Quick Evaluation

## 2023-08-22 NOTE — Op Note (Signed)
Robotic assisted laparoscopic repair of  paraesophageal  hernia with Bio-A Mesh and partial fundoplication    Pre-operative Diagnosis: GERD, hiatal hernia   Post-operative Diagnosis: same      Surgeon: Sterling Big, MD FACS   Assistant: .Gaston Islam, RNFA Required due to the complexity of the case the need for exposure and lack of first assist.   Anesthesia: Gen. 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 The benefits, complications, treatment options, and expected outcomes were discussed with the patient. The risks of bleeding, infection, recurrence of symptoms, failure to resolve symptoms,  esophageal damage, Dysphagia, bowel injury, any of which could require further surgery were reviewed with the patient. The likelihood of improving the patient's symptoms with return to their baseline status is good.  The patient and/or family concurred with the proposed plan, giving informed consent.  The patient was taken to Operating Room, identified  and the procedure verified.  A Time Out was held and the above information confirmed.   Prior to the induction of general anesthesia, antibiotic prophylaxis was administered. VTE prophylaxis was in place. General endotracheal anesthesia was then administered and tolerated well. After the induction, the abdomen was prepped with Chloraprep and draped in the sterile fashion. The patient was positioned in the supine position. Supraumbilical incision created and umbilical hernia encountered, the sac was dissected circumferentially and the hernia sac was excised and the fascial edges were cleaned Cut down technique was  used to enter the abdominal cavity and a Hasson trochar was placed via hernia defect after two vicryl stitches were anchored to the fascia. Pneumoperitoneum was then created with CO2 and tolerated well without any adverse changes in the patient's vital signs.  Initial two 8-mm ports were placed under direct vision.  We encountered significant adhesions that we went ahead and lysis laparoscopic sharply before docking the robot. We restored intra-abdominal anatomy. Enterolysis took about 30-40 minutes and added significant time to the operation, no injuries visualized.   Additional two ports were  placed to assist with retraction and exposure.    The patient was positioned  in reverse Trendelenburg, robot was brought to the surgical field and docked in the standard fashion.  We made sure all the instrumentation was kept indirect view at all times and that there were no collision between the arms. I scrubbed out and went to the console.   I used a robotic arm to retract the liver, the vessel sealer on my right hand and a forced bipolar grasper on my left hand.  There is along the extra 5 mm port allow me ample exposure and the ability to perform meticulous dissection   We Started dividing the lesser omentum via the pars flaccida.  We Were able to dissect the lesser curvature of the stomach and  dissected the fundus free from the right and left crus.  We circumferentially dissected the GE junction.  The hernia sac was also completely reduced and we were able to bring the stomach into the intra-abdominal position.  Attention then was turned to the greater curvature where the short gastrics were divided with  sealer device.  We were able to identify the left crus and again were able to make sure there was a good circumferential dissection and that the hernia sac was completely excised, please note that we performed a meticulous dissection of the hernia sac avoiding any esophageal injuries. Vagal nerves were  identified and preserved  We did perform a good dissection within the mediastinum to allow a complete reduction of the sac, gain esophageal length and a to completely allow an intra-abdominal fundoplication. Using two strips of Bio-A as pledgets we approximated the crus with a 2-0 stratafix  suture. A bio-A 10x7 cm mesh was inserted and secured using Vistaseal.  We also asked anesthesia to inject methylene blue via OG and no gastric or esophageal injuries were observed  We Asked anesthesia to place a 50 French bougie and this went easily.  We also observe trajectory of the bougie. 320 degree  fundoplication was created with multiple 2-0 silk sutures and we placed 3 stitches taking some of the esophagus within that bite.  The fundoplication measured approximately 3-1/2 cm and he was floppy. I was very happy with the way the fundoplication laid and the repair of the hernia.   Inspection of the  upper quadrant was performed. No bleeding, bile  Or esophageal injuries leaks, or bowel injuries were noted. Robotic instruments and robotic arms were undocked in the standard fashion. All the needles were removed under direct visualization.   I scrubbed back in.   Pneumoperitoneum was released.  I pared a ventral defect by placing a manage in an underlay fashion and anchoring the mesh with transfascial sutures in 4 corners under direct visualization.  I then was able to approximate the fascial defect using multiple 0 vicryl in the standard fashion. Please note that the hernia repair and enterolysis  added significant time to the operation since it required additional dissection and lysis of adhesions      4-0 subcuticular Monocryl was used to close the skin incisions. Liposomal marcaine was injected to all the incisions sites.   Dermabond was  applied.  The patient was then extubated and brought to the recovery room in stable condition. Sponge, lap, and needle counts were correct at closure and at the  conclusion of the case.          Please note that the repair of the umbilical hernia and enterolysis added significant time that is substantially greater than typically required I spent additional 45 minutes in repairing the hernia  and performing lysis adhesions

## 2023-08-23 ENCOUNTER — Encounter: Payer: Self-pay | Admitting: Surgery

## 2023-08-23 ENCOUNTER — Other Ambulatory Visit: Payer: Self-pay

## 2023-08-23 DIAGNOSIS — K449 Diaphragmatic hernia without obstruction or gangrene: Secondary | ICD-10-CM | POA: Diagnosis not present

## 2023-08-23 LAB — CBC
HCT: 38.3 % (ref 36.0–46.0)
Hemoglobin: 12.3 g/dL (ref 12.0–15.0)
MCH: 28 pg (ref 26.0–34.0)
MCHC: 32.1 g/dL (ref 30.0–36.0)
MCV: 87 fL (ref 80.0–100.0)
Platelets: 336 10*3/uL (ref 150–400)
RBC: 4.4 MIL/uL (ref 3.87–5.11)
RDW: 14.6 % (ref 11.5–15.5)
WBC: 13.9 10*3/uL — ABNORMAL HIGH (ref 4.0–10.5)
nRBC: 0 % (ref 0.0–0.2)

## 2023-08-23 MED ORDER — CEFAZOLIN SODIUM-DEXTROSE 2-4 GM/100ML-% IV SOLN
INTRAVENOUS | Status: AC
  Filled 2023-08-23: qty 100

## 2023-08-23 MED ORDER — ACETAMINOPHEN 500 MG PO TABS
ORAL_TABLET | ORAL | Status: AC
  Filled 2023-08-23: qty 2

## 2023-08-23 MED ORDER — OXYCODONE HCL 5 MG PO TABS
ORAL_TABLET | ORAL | Status: AC
  Filled 2023-08-23: qty 1

## 2023-08-23 MED ORDER — OXYCODONE HCL 5 MG PO TABS
ORAL_TABLET | ORAL | Status: AC
  Filled 2023-08-23: qty 2

## 2023-08-23 MED ORDER — CEFAZOLIN SODIUM-DEXTROSE 2-4 GM/100ML-% IV SOLN
INTRAVENOUS | Status: AC
Start: 2023-08-23 — End: ?
  Filled 2023-08-23: qty 100

## 2023-08-23 MED ORDER — KETOROLAC TROMETHAMINE 15 MG/ML IJ SOLN
INTRAMUSCULAR | Status: AC
  Filled 2023-08-23: qty 1

## 2023-08-23 MED ORDER — ENOXAPARIN SODIUM 40 MG/0.4ML IJ SOSY
PREFILLED_SYRINGE | INTRAMUSCULAR | Status: AC
Start: 2023-08-23 — End: ?
  Filled 2023-08-23: qty 0.4

## 2023-08-23 MED ORDER — KETOROLAC TROMETHAMINE 15 MG/ML IJ SOLN
INTRAMUSCULAR | Status: AC
Start: 2023-08-23 — End: ?
  Filled 2023-08-23: qty 1

## 2023-08-23 MED ORDER — OXYCODONE HCL 5 MG PO TABS: 5.0000 mg | ORAL_TABLET | Freq: Four times a day (QID) | ORAL | 0 refills | Status: DC | PRN

## 2023-08-23 NOTE — Discharge Instructions (Signed)
In addition to included general post-operative instructions,  Diet: Recommend following Nissen Diet restrictions and recommendations for 4 weeks; Hand out given   Activity: No heavy lifting >20 pounds (children, pets, laundry, garbage) or strenuous activity for 4 weeks, but light activity and walking are encouraged. Do not drive or drink alcohol if taking narcotic pain medications or having pain that might distract from driving.  Wound care: You may shower/get incision wet with soapy water and pat dry (do not rub incisions), but no baths or submerging incision underwater until follow-up.   Medications: Resume all home medications. For mild to moderate pain: acetaminophen (Tylenol) or ibuprofen/naproxen (if no kidney disease). Combining Tylenol with alcohol can substantially increase your risk of causing liver disease. Narcotic pain medications, if prescribed, can be used for severe pain, though may cause nausea, constipation, and drowsiness. Do not combine Tylenol and Percocet (or similar) within a 6 hour period as Percocet (and similar) contain(s) Tylenol. If you do not need the narcotic pain medication, you do not need to fill the prescription.  Call office [phone] / [phone]) at any time if any questions, worsening pain, fevers/chills, bleeding, drainage from incision site, or other concerns.

## 2023-08-23 NOTE — Plan of Care (Signed)
  Problem: Activity: Goal: Risk for activity intolerance will decrease Outcome: Progressing   Problem: Coping: Goal: Level of anxiety will decrease Outcome: Progressing   Problem: Elimination: Goal: Will not experience complications related to urinary retention Outcome: Progressing   Problem: Pain Managment: Goal: General experience of comfort will improve and/or be controlled Outcome: Progressing

## 2023-08-23 NOTE — Progress Notes (Signed)
DISCHARGE NOTE:  Pt given discharge instructions and verbalized understanding. Pt wheeled to car by staff, family providing transportation.

## 2023-08-23 NOTE — Care Management Obs Status (Signed)
MEDICARE OBSERVATION STATUS NOTIFICATION   Patient Details  Name: Elizabeth Mann MRN: [id number] Date of Birth: 1956-01-17   Medicare Observation Status Notification Given:  Yes    Sherilyn Banker 08/23/2023, 10:50 AM

## 2023-08-23 NOTE — Discharge Summary (Signed)
[facility] SUR[facility]ICAL DISCHARGE SUMMARY  Patient ID: Elizabeth Mann MRN: [id number] DOB/AGE: July 15, 1956 68 y.o.  Admit date: 08/22/2023 Discharge date: 08/23/2023  Discharge Diagnoses Patient Active Problem List   Diagnosis Date Noted   Hiatal hernia with gastroesophageal reflux 08/22/2023   S/P repair of paraesophageal hernia 08/22/2023    Consultants None  Procedures 08/22/2023: Robotic assisted laparoscopic paraesophageal hernia repair, 320 degree fundoplication   HPI: Elizabeth Mann is a 68 y.o. female with history of symptomatic paraesophageal hernia who [facility] to [facility] on [facility]/21 for scheduled repair with Dr Longleaf Hospital Course: Informed consent was obtained and documented, and patient underwent uneventful robotic assisted laparoscopic paraesophageal hernia repair with 320 degree fundoplication (Dr Everlene Farrier, 08/22/2023).  Post-operatively, patient did well with expected post-operative soreness. No trouble swallowing. Advancement of patient's diet and ambulation were well-tolerated. The remainder of patient's hospital course was essentially unremarkable, and discharge planning was initiated accordingly with patient safely able to be discharged home with appropriate discharge instructions, pain control, and outpatient  follow-up after all of her questions were answered to her expressed satisfaction.   Discharge Condition: Good   Physical Examination:  Constitutional: Well appearing female; NAD Pulmonary: Normal effort, no respiratory distress Gastrointestinal: Soft, incisional soreness, non-distended, no rebound/guarding Skin: Laparoscopic incisions are CDI with dermabond, no erythema or drainage    Allergies as of 08/23/2023       Reactions   Prednisone Other (See Comments)   headache   Contrast Media [iodinated Contrast Media] Hives   Per patient, the healthcare providers were not sure if the oral contrast or the IV contrast causes hives/  rashes/itching.         Medication List     TAKE these medications    albuterol 108 (90 Base) MCG/ACT inhaler Commonly known as: VENTOLIN HFA Inhale 2 puffs into the lungs every 6 (six) hours as needed for wheezing or shortness of breath.   aspirin EC 81 MG tablet Take 81 mg by mouth daily. Swallow whole.   atorvastatin 10 MG tablet Commonly known as: LIPITOR Take 10 mg by mouth daily.   Breo Ellipta 200-25 MCG/ACT Aepb Generic drug: fluticasone furoate-vilanterol Inhale 1 puff into the lungs daily.   Calcium Carbonate-Vitamin D 600-400 MG-UNIT tablet Take 1 tablet by mouth daily.   cyanocobalamin 1000 MCG tablet Commonly known as: VITAMIN B12 Take 1,000 mcg by mouth daily.   estradiol 0.5 MG tablet Commonly known as: ESTRACE Take 0.5 mg by mouth daily.   fexofenadine 180 MG tablet Commonly known as: ALLEGRA Take 180 mg by mouth daily.   fluticasone 50 MCG/ACT nasal spray Commonly known as: FLONASE Place 1 spray into both nostrils daily.   gabapentin 300 MG capsule Commonly known as: NEURONTIN Take 300 mg by mouth at bedtime.   oxyCODONE 5 MG immediate release tablet Commonly known as: Oxy IR/ROXICODONE Take 1 tablet (5 mg total) by mouth every 6 (six) hours as needed for severe pain (pain score 7-10) or breakthrough pain.   pantoprazole 40 MG tablet Commonly known as: PROTONIX Take 40 mg by mouth daily.   traZODone 100 MG tablet Commonly known as: DESYREL Take 100 mg by mouth at bedtime.   Vitamin D3 1000 units Caps Take 1,000 Units by mouth daily.          Follow-up Information     Leafy Ro, MD. Go on 09/06/2023.   Specialty: General Surgery Why: Go to appointment on 02/05 at 315 PM Contact information: [address]  [address] [place] [address] [phone]                  Time spent on discharge management including discussion of hospital course, clinical condition, outpatient instructions, prescriptions, and  follow up with the patient and members of the medical team: >30 minutes  -- Lynden Oxford , PA-C [facility]  08/23/2023, 9:09 AM [phone] M-F: 7am - 4pm

## 2023-08-23 NOTE — Plan of Care (Signed)
  Problem: Activity: Goal: Risk for activity intolerance will decrease Outcome: Progressing   Problem: Elimination: Goal: Will not experience complications related to urinary retention Outcome: Progressing   

## 2023-09-06 ENCOUNTER — Encounter: Payer: Self-pay | Admitting: Surgery

## 2023-09-06 ENCOUNTER — Ambulatory Visit: Payer: Medicare HMO | Admitting: Surgery

## 2023-09-06 VITALS — BP 118/75 | HR 79 | Temp 98.2°F | Ht 62.0 in | Wt 167.0 lb

## 2023-09-06 DIAGNOSIS — K449 Diaphragmatic hernia without obstruction or gangrene: Secondary | ICD-10-CM

## 2023-09-06 DIAGNOSIS — Z09 Encounter for follow-up examination after completed treatment for conditions other than malignant neoplasm: Secondary | ICD-10-CM

## 2023-09-06 NOTE — Progress Notes (Signed)
 Outpatient Surgical Follow Up  09/06/2023  Elizabeth Mann is an 68 y.o. female.   Chief Complaint  Patient presents with   Routine Post Op    HPI: Elizabeth Mann is 2 weeks out following repair of paraesophageal hernia with partial fundoplication.  She is doing well.  She did have an episode of some dysphagia but, She has been tolerating eggs, rice, mash potatoes, oatmeal and diary products without any issues at all.  S SHe is overall happy and endorses no reflux.  Pulmonary symptoms have also improved She is happy she has lost 13 lbs. Some constipation  Past Medical History:  Diagnosis Date   Anxiety    Asthma    Depression    GERD (gastroesophageal reflux disease)    GERD with esophagitis    Hiatal hernia with gastroesophageal reflux    Hyperlipidemia    Hypertension    Insomnia    PONV (postoperative nausea and vomiting)    Sleep apnea    pt denies sx thinks misunderstood for her insomnia    Past Surgical History:  Procedure Laterality Date   ABDOMINAL HYSTERECTOMY     BREAST BIOPSY Right    neg   ESOPHAGOGASTRODUODENOSCOPY (EGD) WITH PROPOFOL  N/A 07/14/2023   Procedure: ESOPHAGOGASTRODUODENOSCOPY (EGD) WITH BIOPSY;  Surgeon: Jinny Carmine, MD;  Location: [facility];  Service: Endoscopy;  Laterality: N/A;   FOOT SURGERY Left    INSERTION OF MESH N/A 08/22/2023   Procedure: INSERTION OF MESH;  Surgeon: Jordis Laneta FALCON, MD;  Location: [facility];  Service: General;  Laterality: N/A;   OOPHORECTOMY     TENNIS ELBOW RELEASE/NIRSCHEL PROCEDURE     THORACIC LAMINECTOMY FOR SPINAL CORD STIMULATOR N/A 10/11/2021   Procedure: THORACIC LAMINECTOMY FOR SPINAL CORD STIMULATOR ([employer]);  Surgeon: Clois Fret, MD;  Location: [facility];  Service: Neurosurgery;  Laterality: N/A;   XI ROBOTIC ASSISTED PARAESOPHAGEAL HERNIA REPAIR N/A 08/22/2023   Procedure: XI ROBOTIC ASSISTED PARAESOPHAGEAL HERNIA REPAIR, RNFA to assist;  Surgeon: Jordis Laneta FALCON, MD;  Location: [facility];   Service: General;  Laterality: N/A;    Family History  Problem Relation Age of Onset   Breast cancer Neg Hx     Social History:  reports that she has never smoked. She has never been exposed to tobacco smoke. She has never used smokeless tobacco. She reports that she does not drink alcohol and does not use drugs.  Allergies:  Allergies  Allergen Reactions   Prednisone Other (See Comments)    headache   Contrast Media [Iodinated Contrast Media] Hives    Per patient, the healthcare providers were not sure if the oral contrast or the IV contrast causes hives/ rashes/itching.     Medications reviewed.    ROS Full ROS performed and is otherwise negative other than what is stated in HPI   BP 118/75   Pulse 79   Temp 98.2 F (36.8 C)   Ht 5' 2 (1.575 m)   Wt 167 lb (75.8 kg)   SpO2 99%   BMI 30.54 kg/m   Physical Exam NAD alert Abd: soft, appropriate minimal incisional tenderness,, incisions healing well w/o infection  Assessment/Plan: Doing very well after paraesophageal hernia. We Discussed with her again about fundoplication diet.  Initially wanted a refill on narcotics but I was able to educate her about doing over-the-counter Tylenol  ice packs.  She will also try maybe an over-the-counter sleeping aid and not the narcotics at night.  No evidence of surgical complications.  Will  see her back in a couple months  Laneta Luna, MD [facility] General Surgeon

## 2023-09-06 NOTE — Patient Instructions (Addendum)
 Follow up here in 3 months. We will send you a letter about this appointment.   Please call and ask to speak with a nurse if you develop questions or concerns.   GENERAL POST-OPERATIVE PATIENT INSTRUCTIONS   WOUND CARE INSTRUCTIONS:  Keep a dry clean dressing on the wound if there is drainage. The initial bandage may be removed after 24 hours.  Once the wound has quit draining you may leave it open to air.  If clothing rubs against the wound or causes irritation and the wound is not draining you may cover it with a dry dressing during the daytime.  Try to keep the wound dry and avoid ointments on the wound unless directed to do so.  If the wound becomes bright red and painful or starts to drain infected material that is not clear, please contact your physician immediately.  If the wound is mildly pink and has a thick firm ridge underneath it, this is normal, and is referred to as a healing ridge.  This will resolve over the next 4-6 weeks.  BATHING: You may shower if you have been informed of this by your surgeon. However, Please do not submerge in a tub, hot tub, or pool until incisions are completely sealed or have been told by your surgeon that you may do so.  DIET:  You may eat any foods that you can tolerate.  It is a good idea to eat a high fiber diet and take in plenty of fluids to prevent constipation.  If you do become constipated you may want to take a mild laxative or take ducolax tablets on a daily basis until your bowel habits are regular.  Constipation can be very uncomfortable, along with straining, after recent surgery.  ACTIVITY:  You are encouraged to cough and deep breath or use your incentive spirometer if you were given one, every 15-30 minutes when awake.  This will help prevent respiratory complications and low grade fevers post-operatively if you had a general anesthetic.  You may want to hug a pillow when coughing and sneezing to add additional support to the surgical area, if  you had abdominal or chest surgery, which will decrease pain during these times.  You are encouraged to walk and engage in light activity for the next two weeks.  You should not lift more than 20 pounds for 6 weeks total after surgery as it could put you at increased risk for complications.  Twenty pounds is roughly equivalent to a plastic bag of groceries. At that time- Listen to your body when lifting, if you have pain when lifting, stop and then try again in a few days. Soreness after doing exercises or activities of daily living is normal as you get back in to your normal routine.  MEDICATIONS:  Try to take narcotic medications and anti-inflammatory medications, such as tylenol , ibuprofen, naprosyn, etc., with food.  This will minimize stomach upset from the medication.  Should you develop nausea and vomiting from the pain medication, or develop a rash, please discontinue the medication and contact your physician.  You should not drive, make important decisions, or operate machinery when taking narcotic pain medication.  SUNBLOCK Use sun block to incision area over the next year if this area will be exposed to sun. This helps decrease scarring and will allow you avoid a permanent darkened area over your incision.  QUESTIONS:  Please feel free to call our office if you have any questions, and we will be glad to  assist you. [phone]

## 2023-12-06 ENCOUNTER — Ambulatory Visit: Admitting: Surgery

## 2023-12-06 ENCOUNTER — Encounter: Payer: Self-pay | Admitting: Surgery

## 2023-12-06 VITALS — BP 148/83 | HR 62 | Temp 98.2°F | Ht 62.0 in | Wt 160.4 lb

## 2023-12-06 DIAGNOSIS — K449 Diaphragmatic hernia without obstruction or gangrene: Secondary | ICD-10-CM | POA: Diagnosis not present

## 2023-12-06 DIAGNOSIS — Z09 Encounter for follow-up examination after completed treatment for conditions other than malignant neoplasm: Secondary | ICD-10-CM | POA: Diagnosis not present

## 2023-12-06 NOTE — Patient Instructions (Signed)
Please call the office if you have any questions or concerns. 

## 2023-12-07 NOTE — Progress Notes (Signed)
 S   Elizabeth Mann is an 68 y.o. female.   Chief Complaint  Patient presents with   Follow-up    Nissen fundoplication      HPI: Elizabeth Mann is 3-1/2 months out from robotic paraesophageal hernia repair w partial fundoplication.  She is doing well overall.  She endorses no dysphagia no reflux SHe is overall happy,  Pulmonary symptoms have also improved.  She has lost 20 pounds and she is happy with it.  No longer requiring PPIs.  Some chronic cough that has improved but not resolved  Past Medical History:  Diagnosis Date   Anxiety    Asthma    Depression    GERD (gastroesophageal reflux disease)    GERD with esophagitis    Hiatal hernia with gastroesophageal reflux    Hyperlipidemia    Hypertension    Insomnia    PONV (postoperative nausea and vomiting)    Sleep apnea    pt denies sx thinks misunderstood for her insomnia    Past Surgical History:  Procedure Laterality Date   ABDOMINAL HYSTERECTOMY     BREAST BIOPSY Right    neg   ESOPHAGOGASTRODUODENOSCOPY (EGD) WITH PROPOFOL  N/A 07/14/2023   Procedure: ESOPHAGOGASTRODUODENOSCOPY (EGD) WITH BIOPSY;  Surgeon: Marnee Sink, MD;  Location: [facility];  Service: Endoscopy;  Laterality: N/A;   FOOT SURGERY Left    INSERTION OF MESH N/A 08/22/2023   Procedure: INSERTION OF MESH;  Surgeon: Alben Alma, MD;  Location: [facility];  Service: General;  Laterality: N/A;   OOPHORECTOMY     TENNIS ELBOW RELEASE/NIRSCHEL PROCEDURE     THORACIC LAMINECTOMY FOR SPINAL CORD STIMULATOR N/A 10/11/2021   Procedure: THORACIC LAMINECTOMY FOR SPINAL CORD STIMULATOR ([employer]);  Surgeon: Jodeen Munch, MD;  Location: [facility];  Service: Neurosurgery;  Laterality: N/A;   XI ROBOTIC ASSISTED PARAESOPHAGEAL HERNIA REPAIR N/A 08/22/2023   Procedure: XI ROBOTIC ASSISTED PARAESOPHAGEAL HERNIA REPAIR, RNFA to assist;  Surgeon: Alben Alma, MD;  Location: [facility];  Service: General;  Laterality: N/A;    Family History  Problem  Relation Age of Onset   Breast cancer Neg Hx     Social History:  reports that she has never smoked. She has never been exposed to tobacco smoke. She has never used smokeless tobacco. She reports that she does not drink alcohol and does not use drugs.  Allergies:  Allergies  Allergen Reactions   Prednisone Other (See Comments)    headache   Contrast Media [Iodinated Contrast Media] Hives    Per patient, the healthcare providers were not sure if the oral contrast or the IV contrast causes hives/ rashes/itching.     Medications reviewed.     ROS Full ROS performed and is otherwise negative other than what is stated in the HPI    BP (!) 148/83   Pulse 62   Temp 98.2 F (36.8 C) (Oral)   Ht 5\' 2"  (1.575 m)   Wt 160 lb 6.4 oz (72.8 kg)   SpO2 98%   BMI 29.34 kg/m   Physical Exam Vitals and nursing note reviewed. Exam conducted with a chaperone present.  Constitutional:      Appearance: Normal appearance. She is normal weight.  Pulmonary:     Effort: Pulmonary effort is normal.     Breath sounds: No stridor.  Abdominal:     General: Abdomen is flat. There is no distension.     Palpations: Abdomen is soft. There is no mass.  Tenderness: There is no abdominal tenderness. There is no guarding or rebound.     Hernia: No hernia is present.     Comments: Incisions well-healed without infection or hernias  Skin:    General: Skin is warm and dry.     Capillary Refill: Capillary refill takes less than 2 seconds.  Neurological:     General: No focal deficit present.     Mental Status: She is alert and oriented to person, place, and time.  Psychiatric:        Mood and Affect: Mood normal.        Behavior: Behavior normal.        Thought Content: Thought content normal.        Judgment: Judgment normal.     Assessment/Plan: Dekayla is Doing very well after paraesophageal hernia.  There is no evidence of recurrence No evidence of surgical complications.   RTC  prn  Evelia Hipp, MD [facility] General Surgeon

## 2024-05-22 ENCOUNTER — Other Ambulatory Visit (INDEPENDENT_AMBULATORY_CARE_PROVIDER_SITE_OTHER): Payer: Self-pay | Admitting: Vascular Surgery

## 2024-05-22 DIAGNOSIS — M79604 Pain in right leg: Secondary | ICD-10-CM

## 2024-05-23 ENCOUNTER — Encounter (INDEPENDENT_AMBULATORY_CARE_PROVIDER_SITE_OTHER): Payer: Self-pay | Admitting: Nurse Practitioner

## 2024-05-23 ENCOUNTER — Ambulatory Visit (INDEPENDENT_AMBULATORY_CARE_PROVIDER_SITE_OTHER): Admitting: Nurse Practitioner

## 2024-05-23 ENCOUNTER — Encounter (INDEPENDENT_AMBULATORY_CARE_PROVIDER_SITE_OTHER): Payer: Self-pay

## 2024-05-23 ENCOUNTER — Other Ambulatory Visit (INDEPENDENT_AMBULATORY_CARE_PROVIDER_SITE_OTHER)

## 2024-05-23 VITALS — BP 125/81 | HR 79 | Resp 18 | Ht 62.0 in | Wt 164.2 lb

## 2024-05-23 DIAGNOSIS — M5416 Radiculopathy, lumbar region: Secondary | ICD-10-CM

## 2024-05-23 DIAGNOSIS — I83813 Varicose veins of bilateral lower extremities with pain: Secondary | ICD-10-CM

## 2024-05-23 DIAGNOSIS — G8929 Other chronic pain: Secondary | ICD-10-CM

## 2024-05-26 NOTE — Progress Notes (Signed)
 Subjective:    Patient ID: Elizabeth Mann, female    DOB: 27-Jun-1956, 68 y.o.   MRN: [id number] Chief Complaint  Patient presents with   Establish Care    New Patient ABI + consult Bilateral Leg Pain ref. [place]    The patient presents to us  today as a 68 year old female who presents due to bilateral leg pain.  She had ABIs done a day before her visit which showed an ABI of 1.16 on the right and 1.19 on the left.  She also had triphasic waveforms bilaterally with no significant evidence of peripheral arterial disease.  She does have a history of back pain and lower back issues.  She notes that this is likely the cause of her pain.  She does however endorse having some issues with her varicosities bilaterally.  She notes that they are tearing her and uncomfortable.  She notes that she has a family history of varicose veins in has worked for decades standing on hard concrete floors.  She notes that she has worn medical grade compression and elevate her lower extremities and despite this it has not made any significant improvement in the pain or discomfort her varicosities.    Review of Systems  Musculoskeletal:  Positive for arthralgias and back pain.  All other systems reviewed and are negative.      Objective:   Physical Exam Vitals reviewed.  HENT:     Head: Normocephalic.  Cardiovascular:     Rate and Rhythm: Normal rate.     Pulses: Normal pulses.  Pulmonary:     Effort: Pulmonary effort is normal.  Musculoskeletal:        General: Tenderness present.  Skin:    General: Skin is warm and dry.  Neurological:     Mental Status: She is alert and oriented to person, place, and time.  Psychiatric:        Mood and Affect: Mood normal.        Behavior: Behavior normal.        Thought Content: Thought content normal.        Judgment: Judgment normal.     BP 125/81 (BP Location: Left Arm, Patient Position: Sitting, Cuff Size: Normal)   Pulse 79   Resp 18   Ht 5' 2 (1.575  m)   Wt 164 lb 3.2 oz (74.5 kg)   BMI 30.03 kg/m   Past Medical History:  Diagnosis Date   Anxiety    Asthma    Depression    GERD (gastroesophageal reflux disease)    GERD with esophagitis    Hiatal hernia with gastroesophageal reflux    Hyperlipidemia    Hypertension    Insomnia    PONV (postoperative nausea and vomiting)    Sleep apnea    pt denies sx thinks misunderstood for her insomnia    Social History   Socioeconomic History   Marital status: Married    Spouse name: Not on file   Number of children: Not on file   Years of education: Not on file   Highest education level: Not on file  Occupational History   Not on file  Tobacco Use   Smoking status: Never    Passive exposure: Never   Smokeless tobacco: Never  Vaping Use   Vaping status: Never Used  Substance and Sexual Activity   Alcohol use: No   Drug use: Never   Sexual activity: Not on file  Other Topics Concern   Not on file  Social History Narrative   Disabled and dont work anymore. Live with husband at home.    Social Drivers of Corporate Investment Banker Strain: Low Risk  (08/07/2023)   Received from [facility]   Overall Financial Resource Strain (CARDIA)    Difficulty of Paying Living Expenses: Not very hard  Food Insecurity: No Food Insecurity (08/22/2023)   Hunger Vital Sign    Worried About Running Out of Food in the Last Year: Never true    Ran Out of Food in the Last Year: Never true  Transportation Needs: No Transportation Needs (08/22/2023)   PRAPARE - Administrator, Civil Service (Medical): No    Lack of Transportation (Non-Medical): No  Physical Activity: Not on file  Stress: Not on file  Social Connections: Unknown (08/22/2023)   Social Connection and Isolation Panel    Frequency of Communication with Friends and Family: Patient unable to answer    Frequency of Social Gatherings with Friends and Family: Patient unable to answer    Attends Religious  Services: Patient unable to answer    Active Member of Clubs or Organizations: Patient unable to answer    Attends Banker Meetings: Patient unable to answer    Marital Status: Married  Catering Manager Violence: Not At Risk (08/22/2023)   Humiliation, Afraid, Rape, and Kick questionnaire    Fear of Current or Ex-Partner: No    Emotionally Abused: No    Physically Abused: No    Sexually Abused: No    Past Surgical History:  Procedure Laterality Date   ABDOMINAL HYSTERECTOMY     BREAST BIOPSY Right    neg   ESOPHAGOGASTRODUODENOSCOPY (EGD) WITH PROPOFOL  N/A 07/14/2023   Procedure: ESOPHAGOGASTRODUODENOSCOPY (EGD) WITH BIOPSY;  Surgeon: Jinny Carmine, MD;  Location: [facility];  Service: Endoscopy;  Laterality: N/A;   FOOT SURGERY Left    INSERTION OF MESH N/A 08/22/2023   Procedure: INSERTION OF MESH;  Surgeon: Jordis Laneta FALCON, MD;  Location: [facility];  Service: General;  Laterality: N/A;   OOPHORECTOMY     TENNIS ELBOW RELEASE/NIRSCHEL PROCEDURE     THORACIC LAMINECTOMY FOR SPINAL CORD STIMULATOR N/A 10/11/2021   Procedure: THORACIC LAMINECTOMY FOR SPINAL CORD STIMULATOR ([employer]);  Surgeon: Clois Fret, MD;  Location: [facility];  Service: Neurosurgery;  Laterality: N/A;   XI ROBOTIC ASSISTED PARAESOPHAGEAL HERNIA REPAIR N/A 08/22/2023   Procedure: XI ROBOTIC ASSISTED PARAESOPHAGEAL HERNIA REPAIR, RNFA to assist;  Surgeon: Jordis Laneta FALCON, MD;  Location: [facility];  Service: General;  Laterality: N/A;    Family History  Problem Relation Age of Onset   Breast cancer Neg Hx     Allergies  Allergen Reactions   Prednisone Other (See Comments)    headache   Contrast Media [Iodinated Contrast Media] Hives    Per patient, the healthcare providers were not sure if the oral contrast or the IV contrast causes hives/ rashes/itching.        Latest Ref Rng & Units 08/23/2023    6:46 AM 08/22/2023    3:41 PM 02/07/2023    2:59 PM  CBC  WBC 4.0 - 10.5 K/uL  13.9  16.5  11.3   Hemoglobin 12.0 - 15.0 g/dL 87.6  87.1  85.3   Hematocrit 36.0 - 46.0 % 38.3  40.5  45.3   Platelets 150 - 400 K/uL 336  320  312       CMP     Component Value Date/Time  NA 138 02/07/2023 1459   K 4.0 02/07/2023 1459   CL 106 02/07/2023 1459   CO2 23 02/07/2023 1459   GLUCOSE 92 02/07/2023 1459   BUN 14 06/27/2023 0935   CREATININE 0.92 08/22/2023 1541   CALCIUM 10.2 02/07/2023 1459   GFRNONAA >60 08/22/2023 1541     No results found.     Assessment & Plan:   1. Chronic radicular lumbar pain (Primary) Patient's ABIs were normal.  She notes that her lower extremity pain is attributed to her lower back.  2. Varicose veins of both lower extremities with pain The patient does have bilateral varicosities that are uncomfortable for her.  Will plan to have her return at her convenience for noninvasive studies to evaluate for venous reflux.  She is advised to continue with use of medical grade compression, elevation and activity as conservative treatment measures.   Current Outpatient Medications on File Prior to Visit  Medication Sig Dispense Refill   albuterol (VENTOLIN HFA) 108 (90 Base) MCG/ACT inhaler Inhale 2 puffs into the lungs every 6 (six) hours as needed for wheezing or shortness of breath.     ascorbic acid (VITAMIN C) 1000 MG tablet Take 1,000 mg by mouth daily.     aspirin EC 81 MG tablet Take 81 mg by mouth daily. Swallow whole.     atorvastatin (LIPITOR) 10 MG tablet Take 10 mg by mouth daily.  3   Calcium Carbonate-Vitamin D 600-400 MG-UNIT tablet Take 1 tablet by mouth daily.     Cholecalciferol (VITAMIN D3) 1000 units CAPS Take 1,000 Units by mouth daily.     estradiol (ESTRACE) 0.5 MG tablet Take 0.5 mg by mouth daily.     fexofenadine (ALLEGRA) 180 MG tablet Take 180 mg by mouth daily.     fluticasone (FLONASE) 50 MCG/ACT nasal spray Place 1 spray into both nostrils daily.     fluticasone furoate-vilanterol (BREO ELLIPTA) 200-25 MCG/ACT  AEPB Inhale 1 puff into the lungs daily.     gabapentin  (NEURONTIN ) 300 MG capsule Take 300 mg by mouth at bedtime.     pantoprazole (PROTONIX) 40 MG tablet Take 40 mg by mouth daily.  3   traZODone  (DESYREL ) 100 MG tablet Take 100 mg by mouth at bedtime.     vitamin B-12 (CYANOCOBALAMIN) 1000 MCG tablet Take 1,000 mcg by mouth daily.     No current facility-administered medications on file prior to visit.    There are no Patient Instructions on file for this visit. No follow-ups on file.   Jenney Brester E Keeleigh Terris, NP

## 2024-06-10 ENCOUNTER — Other Ambulatory Visit (INDEPENDENT_AMBULATORY_CARE_PROVIDER_SITE_OTHER): Payer: Self-pay | Admitting: Nurse Practitioner

## 2024-06-10 DIAGNOSIS — I83813 Varicose veins of bilateral lower extremities with pain: Secondary | ICD-10-CM

## 2024-06-11 ENCOUNTER — Encounter (INDEPENDENT_AMBULATORY_CARE_PROVIDER_SITE_OTHER): Payer: Self-pay

## 2024-06-11 ENCOUNTER — Ambulatory Visit (INDEPENDENT_AMBULATORY_CARE_PROVIDER_SITE_OTHER)

## 2024-06-11 ENCOUNTER — Ambulatory Visit (INDEPENDENT_AMBULATORY_CARE_PROVIDER_SITE_OTHER): Admitting: Nurse Practitioner

## 2024-06-11 ENCOUNTER — Encounter (INDEPENDENT_AMBULATORY_CARE_PROVIDER_SITE_OTHER): Payer: Self-pay | Admitting: Nurse Practitioner

## 2024-06-11 VITALS — BP 162/82 | HR 66 | Resp 18 | Ht 62.0 in | Wt 165.0 lb

## 2024-06-11 DIAGNOSIS — I83813 Varicose veins of bilateral lower extremities with pain: Secondary | ICD-10-CM

## 2024-06-16 NOTE — Progress Notes (Signed)
 Subjective:    Patient ID: Elizabeth Mann, female    DOB: 09/20/1955, 68 y.o.   MRN: [id number] Chief Complaint  Patient presents with   Follow-up    Follow up pt conv + bilateral venous reflux     The patient returns for followup evaluation after the initial visit. The patient continues to have pain in the lower extremities with dependency. The pain is lessened with elevation. Graduated compression stockings, Class I (20-30 mmHg), have been worn but the stockings do not eliminate the leg pain. Over-the-counter analgesics do not improve the symptoms. The degree of discomfort continues to interfere with daily activities. The patient notes the pain in the legs is causing problems with daily exercise, at the workplace and even with household activities and maintenance such as standing in the kitchen preparing meals and doing dishes.   Venous ultrasound shows normal deep venous system, no evidence of acute or chronic DVT.  Superficial reflux is present in the left GSV    Review of Systems  Cardiovascular:  Positive for leg swelling.  All other systems reviewed and are negative.      Objective:   Physical Exam Vitals reviewed.  HENT:     Head: Normocephalic.  Cardiovascular:     Rate and Rhythm: Normal rate.     Pulses: Normal pulses.  Pulmonary:     Effort: Pulmonary effort is normal.  Musculoskeletal:        General: Tenderness present.  Skin:    General: Skin is warm and dry.  Neurological:     Mental Status: She is alert and oriented to person, place, and time.  Psychiatric:        Mood and Affect: Mood normal.        Behavior: Behavior normal.        Thought Content: Thought content normal.        Judgment: Judgment normal.     BP (!) 162/82 (BP Location: Left Arm, Patient Position: Sitting, Cuff Size: Normal)   Pulse 66   Resp 18   Ht 5' 2 (1.575 m)   Wt 165 lb (74.8 kg)   BMI 30.18 kg/m   Past Medical History:  Diagnosis Date   Anxiety    Asthma     Depression    GERD (gastroesophageal reflux disease)    GERD with esophagitis    Hiatal hernia with gastroesophageal reflux    Hyperlipidemia    Hypertension    Insomnia    PONV (postoperative nausea and vomiting)    Sleep apnea    pt denies sx thinks misunderstood for her insomnia    Social History   Socioeconomic History   Marital status: Married    Spouse name: Not on file   Number of children: Not on file   Years of education: Not on file   Highest education level: Not on file  Occupational History   Not on file  Tobacco Use   Smoking status: Never    Passive exposure: Never   Smokeless tobacco: Never  Vaping Use   Vaping status: Never Used  Substance and Sexual Activity   Alcohol use: No   Drug use: Never   Sexual activity: Not on file  Other Topics Concern   Not on file  Social History Narrative   Disabled and dont work anymore. Live with husband at home.    Social Drivers of Health   Financial Resource Strain: Low Risk  (08/07/2023)   Received from [facility]   Overall Financial Resource Strain (CARDIA)    Difficulty of Paying Living Expenses: Not very hard  Food Insecurity: No Food Insecurity (08/22/2023)   Hunger Vital Sign    Worried About Running Out of Food in the Last Year: Never true    Ran Out of Food in the Last Year: Never true  Transportation Needs: No Transportation Needs (08/22/2023)   PRAPARE - Administrator, Civil Service (Medical): No    Lack of Transportation (Non-Medical): No  Physical Activity: Not on file  Stress: Not on file  Social Connections: Unknown (08/22/2023)   Social Connection and Isolation Panel    Frequency of Communication with Friends and Family: Patient unable to answer    Frequency of Social Gatherings with Friends and Family: Patient unable to answer    Attends Religious Services: Patient unable to answer    Active Member of Clubs or Organizations: Patient unable to answer    Attends Occupational Hygienist Meetings: Patient unable to answer    Marital Status: Married  Catering Manager Violence: Not At Risk (08/22/2023)   Humiliation, Afraid, Rape, and Kick questionnaire    Fear of Current or Ex-Partner: No    Emotionally Abused: No    Physically Abused: No    Sexually Abused: No    Past Surgical History:  Procedure Laterality Date   ABDOMINAL HYSTERECTOMY     BREAST BIOPSY Right    neg   ESOPHAGOGASTRODUODENOSCOPY (EGD) WITH PROPOFOL  N/A 07/14/2023   Procedure: ESOPHAGOGASTRODUODENOSCOPY (EGD) WITH BIOPSY;  Surgeon: Jinny Carmine, MD;  Location: [facility];  Service: Endoscopy;  Laterality: N/A;   FOOT SURGERY Left    INSERTION OF MESH N/A 08/22/2023   Procedure: INSERTION OF MESH;  Surgeon: Jordis Laneta FALCON, MD;  Location: [facility];  Service: General;  Laterality: N/A;   OOPHORECTOMY     TENNIS ELBOW RELEASE/NIRSCHEL PROCEDURE     THORACIC LAMINECTOMY FOR SPINAL CORD STIMULATOR N/A 10/11/2021   Procedure: THORACIC LAMINECTOMY FOR SPINAL CORD STIMULATOR ([employer]);  Surgeon: Clois Fret, MD;  Location: [facility];  Service: Neurosurgery;  Laterality: N/A;   XI ROBOTIC ASSISTED PARAESOPHAGEAL HERNIA REPAIR N/A 08/22/2023   Procedure: XI ROBOTIC ASSISTED PARAESOPHAGEAL HERNIA REPAIR, RNFA to assist;  Surgeon: Jordis Laneta FALCON, MD;  Location: [facility];  Service: General;  Laterality: N/A;    Family History  Problem Relation Age of Onset   Breast cancer Neg Hx     Allergies  Allergen Reactions   Prednisone Other (See Comments)    headache   Contrast Media [Iodinated Contrast Media] Hives    Per patient, the healthcare providers were not sure if the oral contrast or the IV contrast causes hives/ rashes/itching.        Latest Ref Rng & Units 08/23/2023    6:46 AM 08/22/2023    3:41 PM 02/07/2023    2:59 PM  CBC  WBC 4.0 - 10.5 K/uL 13.9  16.5  11.3   Hemoglobin 12.0 - 15.0 g/dL 87.6  87.1  85.3   Hematocrit 36.0 - 46.0 % 38.3  40.5  45.3    Platelets 150 - 400 K/uL 336  320  312       CMP     Component Value Date/Time   NA 138 02/07/2023 1459   K 4.0 02/07/2023 1459   CL 106 02/07/2023 1459   CO2 23 02/07/2023 1459   GLUCOSE 92 02/07/2023 1459   BUN 14 06/27/2023 0935   CREATININE  0.92 08/22/2023 1541   CALCIUM 10.2 02/07/2023 1459   GFRNONAA >60 08/22/2023 1541     No results found.     Assessment & Plan:   1. Varicose veins of both lower extremities with pain (Primary) Recommend  I have reviewed my previous  discussion with the patient regarding  varicose veins and why they cause symptoms. Patient will continue  wearing graduated compression stockings class 1 on a daily basis, beginning first thing in the morning and removing them in the evening.  The patient is CEAP [id number].  The patient has been wearing compression for more than 12 weeks with no or little benefit.  The patient has been exercising daily for more than 12 weeks. The patient has been elevating and taking OTC pain medications for more than 12 weeks.  None of these have have eliminated the pain related to the varicose veins and venous reflux or the discomfort regarding venous congestion.    In addition, behavioral modification including elevation during the day was again discussed and this will continue.  The patient has utilized over the counter pain medications and has been exercising.  However, at this time conservative therapy has not alleviated the patient's symptoms of leg pain and swelling  Recommend: laser ablation of the left great saphenous veins to eliminate the symptoms of pain and swelling of the lower extremities caused by the severe superficial venous reflux disease.    Current Outpatient Medications on File Prior to Visit  Medication Sig Dispense Refill   albuterol (VENTOLIN HFA) 108 (90 Base) MCG/ACT inhaler Inhale 2 puffs into the lungs every 6 (six) hours as needed for wheezing or shortness of breath.     ascorbic acid  (VITAMIN C) 1000 MG tablet Take 1,000 mg by mouth daily.     aspirin EC 81 MG tablet Take 81 mg by mouth daily. Swallow whole.     atorvastatin (LIPITOR) 10 MG tablet Take 10 mg by mouth daily.  3   Calcium Carbonate-Vitamin D 600-400 MG-UNIT tablet Take 1 tablet by mouth daily.     Cholecalciferol (VITAMIN D3) 1000 units CAPS Take 1,000 Units by mouth daily.     estradiol (ESTRACE) 0.5 MG tablet Take 0.5 mg by mouth daily.     fexofenadine (ALLEGRA) 180 MG tablet Take 180 mg by mouth daily.     fluticasone (FLONASE) 50 MCG/ACT nasal spray Place 1 spray into both nostrils daily.     fluticasone furoate-vilanterol (BREO ELLIPTA) 200-25 MCG/ACT AEPB Inhale 1 puff into the lungs daily.     gabapentin  (NEURONTIN ) 300 MG capsule Take 300 mg by mouth at bedtime.     pantoprazole (PROTONIX) 40 MG tablet Take 40 mg by mouth daily.  3   traZODone  (DESYREL ) 100 MG tablet Take 100 mg by mouth at bedtime.     vitamin B-12 (CYANOCOBALAMIN) 1000 MCG tablet Take 1,000 mcg by mouth daily.     No current facility-administered medications on file prior to visit.    There are no Patient Instructions on file for this visit. No follow-ups on file.   Kasia Trego E Jeanell Mangan, NP

## 2024-06-16 NOTE — Progress Notes (Incomplete)
 Subjective:    Patient ID: Elizabeth Mann, female    DOB: 08-Apr-1956, 68 y.o.   MRN: [id number] Chief Complaint  Patient presents with  . Follow-up    Follow up pt conv + bilateral venous reflux     The patient returns for followup evaluation after the initial visit. The patient continues to have pain in the lower extremities with dependency. The pain is lessened with elevation. Graduated compression stockings, Class I (20-30 mmHg), have been worn but the stockings do not eliminate the leg pain. Over-the-counter analgesics do not improve the symptoms. The degree of discomfort continues to interfere with daily activities. The patient notes the pain in the legs is causing problems with daily exercise, at the workplace and even with household activities and maintenance such as standing in the kitchen preparing meals and doing dishes.   Venous ultrasound shows normal deep venous system, no evidence of acute or chronic DVT.  Superficial reflux is present in the ***    Review of Systems     Objective:   Physical Exam  BP (!) 162/82 (BP Location: Left Arm, Patient Position: Sitting, Cuff Size: Normal)   Pulse 66   Resp 18   Ht 5' 2 (1.575 m)   Wt 165 lb (74.8 kg)   BMI 30.18 kg/m   Past Medical History:  Diagnosis Date  . Anxiety   . Asthma   . Depression   . GERD (gastroesophageal reflux disease)   . GERD with esophagitis   . Hiatal hernia with gastroesophageal reflux   . Hyperlipidemia   . Hypertension   . Insomnia   . PONV (postoperative nausea and vomiting)   . Sleep apnea    pt denies sx thinks misunderstood for her insomnia    Social History   Socioeconomic History  . Marital status: Married    Spouse name: Not on file  . Number of children: Not on file  . Years of education: Not on file  . Highest education level: Not on file  Occupational History  . Not on file  Tobacco Use  . Smoking status: Never    Passive exposure: Never  . Smokeless tobacco: Never   Vaping Use  . Vaping status: Never Used  Substance and Sexual Activity  . Alcohol use: No  . Drug use: Never  . Sexual activity: Not on file  Other Topics Concern  . Not on file  Social History Narrative   Disabled and dont work anymore. Live with husband at home.    Social Drivers of Corporate Investment Banker Strain: Low Risk  (08/07/2023)   Received from [facility]   Overall Financial Resource Strain (CARDIA)   . Difficulty of Paying Living Expenses: Not very hard  Food Insecurity: No Food Insecurity (08/22/2023)   Hunger Vital Sign   . Worried About Programme Researcher, Broadcasting/film/video in the Last Year: Never true   . Ran Out of Food in the Last Year: Never true  Transportation Needs: No Transportation Needs (08/22/2023)   PRAPARE - Transportation   . Lack of Transportation (Medical): No   . Lack of Transportation (Non-Medical): No  Physical Activity: Not on file  Stress: Not on file  Social Connections: Unknown (08/22/2023)   Social Connection and Isolation Panel   . Frequency of Communication with Friends and Family: Patient unable to answer   . Frequency of Social Gatherings with Friends and Family: Patient unable to answer   . Attends Religious Services: Patient  unable to answer   . Active Member of Clubs or Organizations: Patient unable to answer   . Attends Banker Meetings: Patient unable to answer   . Marital Status: Married  Catering Manager Violence: Not At Risk (08/22/2023)   Humiliation, Afraid, Rape, and Kick questionnaire   . Fear of Current or Ex-Partner: No   . Emotionally Abused: No   . Physically Abused: No   . Sexually Abused: No    Past Surgical History:  Procedure Laterality Date  . ABDOMINAL HYSTERECTOMY    . BREAST BIOPSY Right    neg  . ESOPHAGOGASTRODUODENOSCOPY (EGD) WITH PROPOFOL  N/A 07/14/2023   Procedure: ESOPHAGOGASTRODUODENOSCOPY (EGD) WITH BIOPSY;  Surgeon: Jinny Carmine, MD;  Location: [facility];  Service:  Endoscopy;  Laterality: N/A;  . FOOT SURGERY Left   . INSERTION OF MESH N/A 08/22/2023   Procedure: INSERTION OF MESH;  Surgeon: Jordis Laneta FALCON, MD;  Location: [facility];  Service: General;  Laterality: N/A;  . OOPHORECTOMY    . TENNIS ELBOW RELEASE/NIRSCHEL PROCEDURE    . THORACIC LAMINECTOMY FOR SPINAL CORD STIMULATOR N/A 10/11/2021   Procedure: THORACIC LAMINECTOMY FOR SPINAL CORD STIMULATOR ([employer]);  Surgeon: Clois Fret, MD;  Location: [facility];  Service: Neurosurgery;  Laterality: N/A;  . XI ROBOTIC ASSISTED PARAESOPHAGEAL HERNIA REPAIR N/A 08/22/2023   Procedure: XI ROBOTIC ASSISTED PARAESOPHAGEAL HERNIA REPAIR, RNFA to assist;  Surgeon: Jordis Laneta FALCON, MD;  Location: [facility];  Service: General;  Laterality: N/A;    Family History  Problem Relation Age of Onset  . Breast cancer Neg Hx     Allergies  Allergen Reactions  . Prednisone Other (See Comments)    headache  . Contrast Media [Iodinated Contrast Media] Hives    Per patient, the healthcare providers were not sure if the oral contrast or the IV contrast causes hives/ rashes/itching.        Latest Ref Rng & Units 08/23/2023    6:46 AM 08/22/2023    3:41 PM 02/07/2023    2:59 PM  CBC  WBC 4.0 - 10.5 K/uL 13.9  16.5  11.3   Hemoglobin 12.0 - 15.0 g/dL 87.6  87.1  85.3   Hematocrit 36.0 - 46.0 % 38.3  40.5  45.3   Platelets 150 - 400 K/uL 336  320  312       CMP     Component Value Date/Time   NA 138 02/07/2023 1459   K 4.0 02/07/2023 1459   CL 106 02/07/2023 1459   CO2 23 02/07/2023 1459   GLUCOSE 92 02/07/2023 1459   BUN 14 06/27/2023 0935   CREATININE 0.92 08/22/2023 1541   CALCIUM 10.2 02/07/2023 1459   GFRNONAA >60 08/22/2023 1541     No results found.     Assessment & Plan:   1. Varicose veins of both lower extremities with pain (Primary) Recommend  I have reviewed my previous  discussion with the patient regarding  varicose veins and why they cause symptoms. Patient will  continue  wearing graduated compression stockings class 1 on a daily basis, beginning first thing in the morning and removing them in the evening.  The patient is CEAP C3sEpAsPr.  The patient has been wearing compression for more than 12 weeks with no or little benefit.  The patient has been exercising daily for more than 12 weeks. The patient has been elevating and taking OTC pain medications for more than 12 weeks.  None of these have have eliminated the  pain related to the varicose veins and venous reflux or the discomfort regarding venous congestion.    In addition, behavioral modification including elevation during the day was again discussed and this will continue.  The patient has utilized over the counter pain medications and has been exercising.  However, at this time conservative therapy has not alleviated the patient's symptoms of leg pain and swelling  Recommend: laser ablation of the left great saphenous veins to eliminate the symptoms of pain and swelling of the lower extremities caused by the severe superficial venous reflux disease.    Current Outpatient Medications on File Prior to Visit  Medication Sig Dispense Refill  . albuterol (VENTOLIN HFA) 108 (90 Base) MCG/ACT inhaler Inhale 2 puffs into the lungs every 6 (six) hours as needed for wheezing or shortness of breath.    SABRA ascorbic acid (VITAMIN C) 1000 MG tablet Take 1,000 mg by mouth daily.    SABRA aspirin EC 81 MG tablet Take 81 mg by mouth daily. Swallow whole.    SABRA atorvastatin (LIPITOR) 10 MG tablet Take 10 mg by mouth daily.  3  . Calcium Carbonate-Vitamin D 600-400 MG-UNIT tablet Take 1 tablet by mouth daily.    . Cholecalciferol (VITAMIN D3) 1000 units CAPS Take 1,000 Units by mouth daily.    SABRA estradiol (ESTRACE) 0.5 MG tablet Take 0.5 mg by mouth daily.    . fexofenadine (ALLEGRA) 180 MG tablet Take 180 mg by mouth daily.    . fluticasone (FLONASE) 50 MCG/ACT nasal spray Place 1 spray into both nostrils daily.    .  fluticasone furoate-vilanterol (BREO ELLIPTA) 200-25 MCG/ACT AEPB Inhale 1 puff into the lungs daily.    . gabapentin  (NEURONTIN ) 300 MG capsule Take 300 mg by mouth at bedtime.    . pantoprazole (PROTONIX) 40 MG tablet Take 40 mg by mouth daily.  3  . traZODone  (DESYREL ) 100 MG tablet Take 100 mg by mouth at bedtime.    . vitamin B-12 (CYANOCOBALAMIN) 1000 MCG tablet Take 1,000 mcg by mouth daily.     No current facility-administered medications on file prior to visit.    There are no Patient Instructions on file for this visit. No follow-ups on file.   Lavert Matousek E Jackquelyn Sundberg, NP

## 2024-08-06 ENCOUNTER — Other Ambulatory Visit: Payer: Self-pay | Admitting: Family Medicine

## 2024-08-06 DIAGNOSIS — Z1231 Encounter for screening mammogram for malignant neoplasm of breast: Secondary | ICD-10-CM

## 2024-08-14 ENCOUNTER — Ambulatory Visit
Admission: RE | Admit: 2024-08-14 | Discharge: 2024-08-14 | Disposition: A | Discharge status: Home / Self Care | Source: Ambulatory Visit | Attending: Family Medicine | Admitting: Family Medicine

## 2024-08-14 DIAGNOSIS — Z1231 Encounter for screening mammogram for malignant neoplasm of breast: Secondary | ICD-10-CM | POA: Insufficient documentation

## 2024-08-20 ENCOUNTER — Encounter (INDEPENDENT_AMBULATORY_CARE_PROVIDER_SITE_OTHER): Payer: Self-pay

## 2024-08-20 ENCOUNTER — Telehealth (INDEPENDENT_AMBULATORY_CARE_PROVIDER_SITE_OTHER): Payer: Self-pay | Admitting: Vascular Surgery

## 2024-08-20 NOTE — Telephone Encounter (Signed)
 Called pt 2x and pt spouse number as well, both numbers were out of service due to this I was unable to LVM. Sent pt a my chart message to call [facility] for scheduling Laser ablation.    - Left leg GSV Laser see [facility]. Auth #[id number]. Exp. 08/20/24 - 02/17/25. 1 unit total.   - LO. 1 week post ablation Left GSV laser.  - Fu 4 week post left GSV laser. See GS/FB.

## 2024-08-27 ENCOUNTER — Other Ambulatory Visit (INDEPENDENT_AMBULATORY_CARE_PROVIDER_SITE_OTHER): Payer: Self-pay

## 2024-08-27 ENCOUNTER — Telehealth (INDEPENDENT_AMBULATORY_CARE_PROVIDER_SITE_OTHER): Payer: Self-pay | Admitting: Vascular Surgery

## 2024-08-27 MED ORDER — ALPRAZOLAM 0.5 MG PO TABS: ORAL_TABLET | ORAL | 0 refills | Status: AC

## 2024-08-27 NOTE — Telephone Encounter (Signed)
 Pt is scheduled for 09/12/24 for her Laser Ablation with Jama Hacker MD. Please send pt prescription into to pharmacy in her chart (verified with pt).

## 2024-08-27 NOTE — Telephone Encounter (Signed)
 Done

## 2024-08-27 NOTE — Telephone Encounter (Signed)
 Typically would recommend Tylenol  or ibuprofen for ongoing pain pain

## 2024-08-27 NOTE — Telephone Encounter (Signed)
 Called pt 2x for scheduling laser and LVM    Left leg GSV Laser see [facility]. Auth #[id number]. Exp. 08/20/24 - 02/17/25. 1 unit total   LO 1 week post ablation Left GSV laser  Fu 4 week post left GSV laser. See GS/FB.

## 2024-08-27 NOTE — Telephone Encounter (Signed)
 Patient notified with medical recommendations and verbalized understanding

## 2024-08-27 NOTE — Telephone Encounter (Signed)
 Patient left a message stating that she is having left leg throbbing pain and she requesting for pain medication for relief. Patient scheduled for laser ablation on 09/12/24

## 2024-09-04 ENCOUNTER — Telehealth: Payer: Self-pay | Admitting: Surgery

## 2024-09-04 ENCOUNTER — Telehealth (INDEPENDENT_AMBULATORY_CARE_PROVIDER_SITE_OTHER): Payer: Self-pay

## 2024-09-04 NOTE — Telephone Encounter (Signed)
 Patient had robotic paraesophageal hernia repair done 08/22/23 Dr. Jordis. Patient states that she has noticed over the past few months a pinching like sensation in her tummy area.  Last night she bent over to take off her socks and she got a really bad stabbing like feeling in that area.  She states now the pinching is more noticeable and she is concerned that something might have gone wrong with the mesh.  She states I know my body and something just isn't right.  Please call patient to advise.

## 2024-09-04 NOTE — Telephone Encounter (Signed)
 Patient called into nurse line to request a prescription for compression stockings prior to her upcoming laser procedure on the 12th, prescription forwarded to NP for signature

## 2024-09-12 ENCOUNTER — Other Ambulatory Visit (INDEPENDENT_AMBULATORY_CARE_PROVIDER_SITE_OTHER): Admitting: Vascular Surgery

## 2024-09-19 ENCOUNTER — Encounter (INDEPENDENT_AMBULATORY_CARE_PROVIDER_SITE_OTHER)

## 2024-09-23 ENCOUNTER — Ambulatory Visit: Admitting: Surgery

## 2024-10-10 ENCOUNTER — Ambulatory Visit (INDEPENDENT_AMBULATORY_CARE_PROVIDER_SITE_OTHER): Admitting: Vascular Surgery
